# Patient Record
Sex: Female | Born: 1940
Health system: Southern US, Community
[De-identification: ages and names within clinical notes are randomized; demographics above are authoritative.]

## PROBLEM LIST (undated history)

## (undated) DIAGNOSIS — K589 Irritable bowel syndrome without diarrhea: Secondary | ICD-10-CM

## (undated) DIAGNOSIS — D179 Benign lipomatous neoplasm, unspecified: Secondary | ICD-10-CM

## (undated) DIAGNOSIS — T7840XA Allergy, unspecified, initial encounter: Secondary | ICD-10-CM

## (undated) DIAGNOSIS — R011 Cardiac murmur, unspecified: Secondary | ICD-10-CM

## (undated) DIAGNOSIS — D539 Nutritional anemia, unspecified: Secondary | ICD-10-CM

## (undated) DIAGNOSIS — N840 Polyp of corpus uteri: Secondary | ICD-10-CM

## (undated) DIAGNOSIS — C4490 Unspecified malignant neoplasm of skin, unspecified: Secondary | ICD-10-CM

## (undated) DIAGNOSIS — M1990 Unspecified osteoarthritis, unspecified site: Secondary | ICD-10-CM

## (undated) DIAGNOSIS — K219 Gastro-esophageal reflux disease without esophagitis: Secondary | ICD-10-CM

## (undated) DIAGNOSIS — J449 Chronic obstructive pulmonary disease, unspecified: Secondary | ICD-10-CM

## (undated) DIAGNOSIS — M8580 Other specified disorders of bone density and structure, unspecified site: Secondary | ICD-10-CM

## (undated) DIAGNOSIS — I700 Atherosclerosis of aorta: Secondary | ICD-10-CM

## (undated) DIAGNOSIS — E785 Hyperlipidemia, unspecified: Secondary | ICD-10-CM

## (undated) DIAGNOSIS — G43909 Migraine, unspecified, not intractable, without status migrainosus: Secondary | ICD-10-CM

## (undated) DIAGNOSIS — K635 Polyp of colon: Secondary | ICD-10-CM

## (undated) DIAGNOSIS — H269 Unspecified cataract: Secondary | ICD-10-CM

## (undated) HISTORY — DX: Polyp of colon: K63.5

## (undated) HISTORY — DX: Benign lipomatous neoplasm, unspecified: D17.9

## (undated) HISTORY — DX: Cardiac murmur, unspecified: R01.1

## (undated) HISTORY — DX: Gastro-esophageal reflux disease without esophagitis: K21.9

## (undated) HISTORY — DX: Polyp of corpus uteri: N84.0

## (undated) HISTORY — DX: Allergy, unspecified, initial encounter: T78.40XA

## (undated) HISTORY — DX: Unspecified malignant neoplasm of skin, unspecified: C44.90

## (undated) HISTORY — PX: COLONOSCOPY: SHX174

## (undated) HISTORY — PX: TONSILLECTOMY AND ADENOIDECTOMY: SUR1326

## (undated) HISTORY — DX: Hyperlipidemia, unspecified: E78.5

## (undated) HISTORY — PX: CATARACT EXTRACTION: SUR2

## (undated) HISTORY — DX: Irritable bowel syndrome, unspecified: K58.9

## (undated) HISTORY — DX: Chronic obstructive pulmonary disease, unspecified: J44.9

## (undated) HISTORY — DX: Other specified disorders of bone density and structure, unspecified site: M85.80

## (undated) HISTORY — DX: Atherosclerosis of aorta: I70.0

## (undated) HISTORY — DX: Unspecified cataract: H26.9

## (undated) HISTORY — PX: MOLE REMOVAL: SHX2046

## (undated) HISTORY — DX: Unspecified osteoarthritis, unspecified site: M19.90

## (undated) HISTORY — DX: Nutritional anemia, unspecified: D53.9

---

## 1998-07-14 DIAGNOSIS — D179 Benign lipomatous neoplasm, unspecified: Secondary | ICD-10-CM

## 1998-07-14 HISTORY — DX: Benign lipomatous neoplasm, unspecified: D17.9

## 1998-09-04 ENCOUNTER — Other Ambulatory Visit: Admission: RE | Admit: 1998-09-04 | Discharge: 1998-09-04 | Payer: Self-pay | Admitting: Obstetrics and Gynecology

## 1999-05-24 ENCOUNTER — Encounter: Admission: RE | Admit: 1999-05-24 | Discharge: 1999-05-24 | Payer: Self-pay | Admitting: Geriatric Medicine

## 1999-05-24 ENCOUNTER — Encounter: Payer: Self-pay | Admitting: Geriatric Medicine

## 1999-10-09 ENCOUNTER — Other Ambulatory Visit: Admission: RE | Admit: 1999-10-09 | Discharge: 1999-10-09 | Payer: Self-pay | Admitting: Obstetrics and Gynecology

## 2000-06-01 ENCOUNTER — Encounter: Admission: RE | Admit: 2000-06-01 | Discharge: 2000-06-01 | Payer: Self-pay | Admitting: Geriatric Medicine

## 2000-06-01 ENCOUNTER — Encounter: Payer: Self-pay | Admitting: Geriatric Medicine

## 2000-11-03 ENCOUNTER — Other Ambulatory Visit: Admission: RE | Admit: 2000-11-03 | Discharge: 2000-11-03 | Payer: Self-pay | Admitting: Obstetrics and Gynecology

## 2001-06-07 ENCOUNTER — Encounter: Payer: Self-pay | Admitting: Geriatric Medicine

## 2001-06-07 ENCOUNTER — Encounter: Admission: RE | Admit: 2001-06-07 | Discharge: 2001-06-07 | Payer: Self-pay | Admitting: Geriatric Medicine

## 2001-10-21 ENCOUNTER — Other Ambulatory Visit: Admission: RE | Admit: 2001-10-21 | Discharge: 2001-10-21 | Payer: Self-pay | Admitting: *Deleted

## 2002-06-08 ENCOUNTER — Encounter: Admission: RE | Admit: 2002-06-08 | Discharge: 2002-06-08 | Payer: Self-pay | Admitting: Geriatric Medicine

## 2002-06-08 ENCOUNTER — Encounter: Payer: Self-pay | Admitting: Geriatric Medicine

## 2002-10-31 ENCOUNTER — Other Ambulatory Visit: Admission: RE | Admit: 2002-10-31 | Discharge: 2002-10-31 | Payer: Self-pay | Admitting: *Deleted

## 2003-06-11 ENCOUNTER — Emergency Department (HOSPITAL_COMMUNITY): Admission: AD | Admit: 2003-06-11 | Discharge: 2003-06-11 | Payer: Self-pay | Admitting: Family Medicine

## 2003-06-21 ENCOUNTER — Encounter: Admission: RE | Admit: 2003-06-21 | Discharge: 2003-06-21 | Payer: Self-pay | Admitting: Geriatric Medicine

## 2003-12-15 ENCOUNTER — Other Ambulatory Visit: Admission: RE | Admit: 2003-12-15 | Discharge: 2003-12-15 | Payer: Self-pay | Admitting: *Deleted

## 2004-07-01 ENCOUNTER — Ambulatory Visit: Payer: Self-pay | Admitting: Internal Medicine

## 2004-07-19 ENCOUNTER — Encounter: Admission: RE | Admit: 2004-07-19 | Discharge: 2004-07-19 | Payer: Self-pay | Admitting: *Deleted

## 2004-08-16 ENCOUNTER — Ambulatory Visit: Payer: Self-pay | Admitting: Internal Medicine

## 2005-04-02 ENCOUNTER — Other Ambulatory Visit: Admission: RE | Admit: 2005-04-02 | Discharge: 2005-04-02 | Payer: Self-pay | Admitting: *Deleted

## 2005-09-26 ENCOUNTER — Encounter: Admission: RE | Admit: 2005-09-26 | Discharge: 2005-09-26 | Payer: Self-pay | Admitting: Geriatric Medicine

## 2006-06-13 DIAGNOSIS — N840 Polyp of corpus uteri: Secondary | ICD-10-CM

## 2006-06-13 HISTORY — DX: Polyp of corpus uteri: N84.0

## 2006-06-16 ENCOUNTER — Other Ambulatory Visit: Admission: RE | Admit: 2006-06-16 | Discharge: 2006-06-16 | Payer: Self-pay | Admitting: Obstetrics and Gynecology

## 2006-11-11 ENCOUNTER — Encounter: Admission: RE | Admit: 2006-11-11 | Discharge: 2006-11-11 | Payer: Self-pay | Admitting: Geriatric Medicine

## 2007-08-16 ENCOUNTER — Other Ambulatory Visit: Admission: RE | Admit: 2007-08-16 | Discharge: 2007-08-16 | Payer: Self-pay | Admitting: Obstetrics and Gynecology

## 2007-11-29 ENCOUNTER — Telehealth: Payer: Self-pay | Admitting: Internal Medicine

## 2007-12-08 ENCOUNTER — Telehealth: Payer: Self-pay | Admitting: Internal Medicine

## 2007-12-08 ENCOUNTER — Ambulatory Visit: Payer: Self-pay | Admitting: Internal Medicine

## 2007-12-13 ENCOUNTER — Telehealth: Payer: Self-pay | Admitting: Internal Medicine

## 2007-12-13 DIAGNOSIS — K635 Polyp of colon: Secondary | ICD-10-CM

## 2007-12-13 HISTORY — DX: Polyp of colon: K63.5

## 2007-12-14 ENCOUNTER — Ambulatory Visit: Payer: Self-pay | Admitting: Internal Medicine

## 2007-12-14 ENCOUNTER — Encounter: Payer: Self-pay | Admitting: Internal Medicine

## 2007-12-16 ENCOUNTER — Encounter: Payer: Self-pay | Admitting: Internal Medicine

## 2007-12-23 ENCOUNTER — Encounter: Admission: RE | Admit: 2007-12-23 | Discharge: 2007-12-23 | Payer: Self-pay | Admitting: Geriatric Medicine

## 2008-08-07 ENCOUNTER — Inpatient Hospital Stay (HOSPITAL_COMMUNITY): Admission: EM | Admit: 2008-08-07 | Discharge: 2008-08-08 | Payer: Self-pay | Admitting: *Deleted

## 2008-08-07 ENCOUNTER — Ambulatory Visit: Payer: Self-pay | Admitting: Cardiology

## 2008-09-08 ENCOUNTER — Ambulatory Visit (HOSPITAL_COMMUNITY): Admission: RE | Admit: 2008-09-08 | Discharge: 2008-09-08 | Payer: Self-pay | Admitting: Geriatric Medicine

## 2008-12-27 ENCOUNTER — Encounter: Admission: RE | Admit: 2008-12-27 | Discharge: 2008-12-27 | Payer: Self-pay | Admitting: Geriatric Medicine

## 2009-07-14 LAB — HM DEXA SCAN

## 2009-12-31 ENCOUNTER — Encounter: Admission: RE | Admit: 2009-12-31 | Discharge: 2009-12-31 | Payer: Self-pay | Admitting: Obstetrics and Gynecology

## 2010-02-04 LAB — HM PAP SMEAR: HM Pap smear: NEGATIVE

## 2010-08-04 ENCOUNTER — Encounter: Payer: Self-pay | Admitting: Geriatric Medicine

## 2010-10-28 LAB — CARDIAC PANEL(CRET KIN+CKTOT+MB+TROPI)
CK, MB: 3.6 ng/mL (ref 0.3–4.0)
Relative Index: 3.6 — ABNORMAL HIGH (ref 0.0–2.5)
Relative Index: INVALID (ref 0.0–2.5)
Troponin I: <0.01 ng/mL (ref 0.00–0.06)

## 2010-10-28 LAB — DIFFERENTIAL
Basophils Absolute: 0 10*3/uL (ref 0.0–0.1)
Basophils Relative: 1 % (ref 0–1)
Eosinophils Absolute: 0.1 10*3/uL (ref 0.0–0.7)
Eosinophils Relative: 2 % (ref 0–5)
Lymphs Abs: 1.6 10*3/uL (ref 0.7–4.0)
Neutrophils Relative %: 50 % (ref 43–77)

## 2010-10-28 LAB — CBC
HCT: 38.6 % (ref 36.0–46.0)
Hemoglobin: 13 g/dL (ref 12.0–15.0)
MCV: 95.7 fL (ref 78.0–100.0)
Platelets: 202 10*3/uL (ref 150–400)
RDW: 13.1 % (ref 11.5–15.5)
WBC: 4.3 10*3/uL (ref 4.0–10.5)

## 2010-10-28 LAB — POCT I-STAT, CHEM 8
BUN: 24 mg/dL — ABNORMAL HIGH (ref 6–23)
Chloride: 108 mEq/L (ref 96–112)
Creatinine, Ser: 1.3 mg/dL — ABNORMAL HIGH (ref 0.4–1.2)
HCT: 40 % (ref 36.0–46.0)
Hemoglobin: 13.6 g/dL (ref 12.0–15.0)
Potassium: 4.3 mEq/L (ref 3.5–5.1)
Sodium: 139 mEq/L (ref 135–145)
TCO2: 26 mmol/L (ref 0–100)

## 2010-10-28 LAB — T4, FREE: Free T4: 0.92 ng/dL (ref 0.89–1.80)

## 2010-10-28 LAB — T3: T3, Total: 75.1 ng/dl — ABNORMAL LOW (ref 80.0–204.0)

## 2010-10-28 LAB — CK TOTAL AND CKMB (NOT AT ARMC)
CK, MB: 4 ng/mL (ref 0.3–4.0)
Total CK: 112 U/L (ref 7–177)

## 2010-10-28 LAB — POCT CARDIAC MARKERS

## 2010-10-28 LAB — LIPID PANEL
LDL Cholesterol: 119 mg/dL — ABNORMAL HIGH (ref 0–99)
Total CHOL/HDL Ratio: 2.3 RATIO
Triglycerides: 75 mg/dL (ref <150)

## 2010-10-28 LAB — TSH: TSH: 5.202 u[IU]/mL — ABNORMAL HIGH (ref 0.350–4.500)

## 2010-10-28 LAB — BASIC METABOLIC PANEL
CO2: 25 mEq/L (ref 19–32)
GFR calc Af Amer: >60 mL/min (ref >60)

## 2010-11-26 NOTE — Consult Note (Signed)
NAMELETIZIA, HOOK                 ACCOUNT NO.:  [id number]   MEDICAL RECORD NO.:  [id number]          PATIENT TYPE:  INP   LOCATION:  3707                         FACILITY:  [facility]   PHYSICIAN:  Francisca December, M.D.  DATE OF BIRTH:  1940/08/08   DATE OF CONSULTATION:  08/08/2008  DATE OF DISCHARGE:  08/08/2008                                 CONSULTATION   REFERRING PHYSICIAN:  Kela Millin, MD   REASON FOR CONSULTATION:  Bilateral arm pain.   HISTORY OF PRESENT ILLNESS:  Ms. Pellicane is a very pleasant 70 year old  woman who is generally quite healthy who presented to [facility] Emergency  Room on the morning of August 07, 2008 after three episodes of  bilateral arm aching pain.  This was initially from the elbow to the  shoulder and felt like blood pressure cuffs being inflated.  That  happened at rest.  It was nocturnal.  A few days later again, she had a  second episode.  These were associated with radiation down into the  fingertips and some numbness and tingling.  She finally had an episode  at 4 a.m. on the morning of admission that was associated with some  nausea and diaphoresis.  No shortness of breath.  She does not recall  the discomfort radiated across her chest.  The episode lasted about 20-  30 minutes and she presented to the emergency room for further  evaluation.   The patient denies previous cardiac symptoms.  She is not treated for  hypertension, diabetes or dyslipidemia.  She is very physically active.   PAST MEDICAL HISTORY:  Unremarkable:  She has allergic rhinitis and is  on postmenopausal estrogen replacement.   DRUG ALLERGIES:  IV CONTRAST DYE which causes urticaria and also some  throat swelling.  LIDOCAINE has caused heart racing.   MEDICATIONS:  She takes multivitamin daily, uses nasal spray and has an  estradiol compound that she uses daily.   SOCIAL HISTORY:  She is a social drinker.  No tobacco use.  Discontinued  40 years ago.  She is retired.   Volunteers at the hospital frequently.   REVIEW OF SYSTEMS:  Ten-point review of systems is negative except as  mentioned above.   PHYSICAL EXAMINATION:  VITAL SIGNS:  The blood pressure is 138/58, pulse  is 60 and regular, respiratory rate 18, temperature 97.7, O2 saturation  on room air 99%.  GENERAL:  This is a well-appearing 70 year old thin Caucasian woman in  no distress.  HEENT:  Unremarkable.  The head is atraumatic and normocephalic.  The  pupils are equal, round and reactive to light.  Sclerae are anicteric.  Oral mucosa is pink and moist.  Teeth and gums in good repair.  Tongue  is not coated.  NECK:  Supple without thyromegaly or masses.  The carotid upstrokes are  normal.  There is no bruit.  There is no jugular venous distention.  CHEST:  Clear with adequate excursion bilaterally.  No wheezes, rales or  rhonchi.  HEART:  Regular rhythm, normal S1-S2 is heard.  No S3-S4, murmur, click  or rub noted.  ABDOMEN:  Soft, scaphoid, nontender.  No midline pulsatile mass.  EXTERNAL GENITALIA:  Without lesions.  RECTAL:  Not performed.  EXTREMITY:  Full range of motion.  No edema and intact distal pulses and  this includes both legs and upper extremities.  NEUROLOGIC:  Cranial  nerves II-XII are intact.  Motor and sensory grossly intact.  Gait is  intact.  SKIN:  Warm, dry and clear.   ACCESSORY CLINICAL DATA:  Admission hemogram is normal as are serum  electrolytes, creatinine is 1.04, BUN 22, CK-MB, troponin and total CK  enzymes are normal x3.  There was a slight increase in relative index on  her initial CK upper limit of normal being 2.5 and the result being 3.6.  Troponins, however, have been completely negative.  Her total  cholesterol 239 with a triglyceride of 75, HDL of 105 and LDL 119, and  ratio 2.3.  Her TSH was slightly elevated at 5.202.  Her T3 is slightly  low at 75.1, the T4 is normal at 0.92.   Electrocardiogram on admission normal.  Repeat ECG the  following morning  showed a septal Q-wave that is new.  This could easily be due to lead  placement.   Admission chest x-ray showed no acute disease.  There was slight  hyperexpansion suggesting emphysema.   IMPRESSION:  1. Unstable angina which is somewhat atypical in nature but still in a      70 year old woman quite concerning for a cardiac etiology.  There      does not appear to be any evidence of myocardial necrosis.  2. Risk factors for coronary artery disease are age alone.  3. Mild hypothyroidism.   PLAN:  1. The patient is considered low risk at this point having ruled out      with no recurrent symptoms.  Risk factors are minimal.  She is an      excellent candidate for outpatient myocardial perfusion imaging      with exercise.  This has been arranged for later in a week.  2. We will discharge on aspirin and sublingual nitroglycerin as well      as her previous  medications.  3. I will communicate with her primary physician, Dr. Merlene Laughter      regarding the mild hypothyroidism and his desire whether or not to      treat.      Francisca December, M.D.  Electronically Signed     JHE/MEDQ  D:  08/08/2008  T:  08/08/2008  Job:  [id number]   cc:   Hal T. Stoneking, M.D.

## 2010-11-26 NOTE — H&P (Signed)
NAME:  Cindy Valencia, Cindy Valencia                 ACCOUNT NO.:  [id number]   MEDICAL RECORD NO.:  [id number]          PATIENT TYPE:  INP   LOCATION:  1825                         FACILITY:  [facility]   PHYSICIAN:  Darryl D. Prime, MD    DATE OF BIRTH:  Feb 06, 1941   DATE OF ADMISSION:  08/07/2008  DATE OF DISCHARGE:                              HISTORY & PHYSICAL   The patient is full code.   PRIMARY CARE PHYSICIAN:  Hal T. Stoneking, M.D.   CHIEF COMPLAINT:  Chest pain.   HISTORY OF PRESENT ILLNESS:  Cindy Valencia is a 70 year old female who has  no significant past medical history who notes 3 episodes of bilateral  arm tightness over the last 7 days.  These symptoms were of relatively  sudden onset with radiation to her elbows on the first two occasions.  At 4:00 a.m. she had  symptoms at rest that typically last a minute with  radiation to her fingertips.  The patient denies any chest pain, nausea  or shortness of breath.  She did have associated tachypalpitations and  diaphoresis with the last episode.  The patient checked on the Internet  to find the etiology of these symptoms and notes that they sometimes may  be associated with an acute coronary syndrome.  Her husband brought her  to the emergency room.  The patient chewed an aspirin before coming to  the emergency room, full dose.  Her last stress test was approximately 5  years ago which was unremarkable.   PAST MEDICAL HISTORY AND PAST SURGICAL HISTORY:  History of allergic  rhinitis.   ALLERGIES:  She is allergic to IV CONTRAST DYE which causes hives and  her throat closing off  She apparently is also allergic to LIDOCAINE  which causes heart racing.   MEDICATIONS:  She is on multivitamin daily, a nasal spray she is unsure  of what type, and estradiol/Premarin compound daily.   SOCIAL HISTORY:  She is a social drinker.  She denies any tobacco use  currently, discontinued 40 years ago, smoked for a few years.   FAMILY HISTORY:  Mother  passed away possibly due to complications of  heart disease at the age of 90.  No premature coronary artery in the  family.   REVIEW OF SYSTEMS:  Fourteen-point review of systems negative unless  stated above.   PHYSICAL EXAM:  VITAL SIGNS:  Temperature is 97.7 with a blood pressure  138/58, pulse of 51, respiratory rate of 18, sats are 99% on room air.  GENERAL:  She is a female who looks much younger than her stated age  sitting upright in no acute distress.  HEENT:  Normocephalic, atraumatic.  Pupils are equal, round and reactive  to light.  The oropharynx is moist.  NECK:  Supple with no lymphadenopathy or thyromegaly.  No carotid  bruits, no jugular venous distention.  LUNGS:  Clear to auscultation bilaterally.  CARDIOVASCULAR EXAM:  Regular rhythm and rate with no murmurs, rubs, or  gallops.  Normal S1 and S2.  No S3 or S4.  ABDOMEN:  Soft, nontender, nondistended.  EXTREMITIES:  Show no clubbing, cyanosis or edema.   LABORATORY DATA:  White count is 4.3 with a hemoglobin of 13, hematocrit  38.6, platelets 202, segs of 50%.  Cardiac markers point of care at 5:57  a.m. were negative.  Chest x-ray showed no acute cardiopulmonary  disease.  No prior chest x-ray to compare.  EKG showed sinus bradycardia  at 48 beats per minute, otherwise negative.  No prior available.   ASSESSMENT AND PLAN:  This is a patient with atypical chest pain who  will be admitted to rule out acute coronary syndrome to a telemetry bed.  We will check cardiac markers x3, a lipid panel and a TSH.  We will set  her up for a Cardiolite stress test today with the results of this  determining the direction of her further care.  She will be on aspirin.      Darryl D. Prime, MD  Electronically Signed     DDP/MEDQ  D:  08/07/2008  T:  08/07/2008  Job:  [id number]

## 2010-12-11 ENCOUNTER — Other Ambulatory Visit: Payer: Self-pay | Admitting: Geriatric Medicine

## 2010-12-11 DIAGNOSIS — Z1231 Encounter for screening mammogram for malignant neoplasm of breast: Secondary | ICD-10-CM

## 2011-01-06 ENCOUNTER — Ambulatory Visit
Admission: RE | Admit: 2011-01-06 | Discharge: 2011-01-06 | Disposition: A | Discharge status: Home / Self Care | Payer: Medicare Other | Source: Ambulatory Visit | Attending: Geriatric Medicine | Admitting: Geriatric Medicine

## 2011-01-06 DIAGNOSIS — Z1231 Encounter for screening mammogram for malignant neoplasm of breast: Secondary | ICD-10-CM

## 2011-06-10 ENCOUNTER — Other Ambulatory Visit: Payer: Self-pay | Admitting: Geriatric Medicine

## 2011-06-10 DIAGNOSIS — M4800 Spinal stenosis, site unspecified: Secondary | ICD-10-CM

## 2011-06-12 ENCOUNTER — Other Ambulatory Visit: Payer: Medicare Other

## 2011-06-12 ENCOUNTER — Ambulatory Visit
Admission: RE | Admit: 2011-06-12 | Discharge: 2011-06-12 | Disposition: A | Discharge status: Home / Self Care | Payer: Medicare Other | Source: Ambulatory Visit | Attending: Geriatric Medicine | Admitting: Geriatric Medicine

## 2011-06-12 DIAGNOSIS — M4800 Spinal stenosis, site unspecified: Secondary | ICD-10-CM

## 2011-08-28 DIAGNOSIS — M899 Disorder of bone, unspecified: Secondary | ICD-10-CM | POA: Diagnosis not present

## 2011-08-28 DIAGNOSIS — J309 Allergic rhinitis, unspecified: Secondary | ICD-10-CM | POA: Diagnosis not present

## 2011-08-28 DIAGNOSIS — E7800 Pure hypercholesterolemia, unspecified: Secondary | ICD-10-CM | POA: Diagnosis not present

## 2011-08-28 DIAGNOSIS — R110 Nausea: Secondary | ICD-10-CM | POA: Diagnosis not present

## 2011-08-28 DIAGNOSIS — Z0000 Encounter for general adult medical examination without abnormal findings: Secondary | ICD-10-CM | POA: Diagnosis not present

## 2011-08-28 DIAGNOSIS — M4800 Spinal stenosis, site unspecified: Secondary | ICD-10-CM | POA: Diagnosis not present

## 2011-08-28 DIAGNOSIS — Z79899 Other long term (current) drug therapy: Secondary | ICD-10-CM | POA: Diagnosis not present

## 2011-08-28 DIAGNOSIS — M949 Disorder of cartilage, unspecified: Secondary | ICD-10-CM | POA: Diagnosis not present

## 2011-09-02 DIAGNOSIS — Z79899 Other long term (current) drug therapy: Secondary | ICD-10-CM | POA: Diagnosis not present

## 2011-09-02 DIAGNOSIS — E7800 Pure hypercholesterolemia, unspecified: Secondary | ICD-10-CM | POA: Diagnosis not present

## 2011-09-04 DIAGNOSIS — M545 Low back pain, unspecified: Secondary | ICD-10-CM | POA: Diagnosis not present

## 2011-09-04 DIAGNOSIS — M4120 Other idiopathic scoliosis, site unspecified: Secondary | ICD-10-CM | POA: Diagnosis not present

## 2011-09-04 DIAGNOSIS — M48061 Spinal stenosis, lumbar region without neurogenic claudication: Secondary | ICD-10-CM | POA: Diagnosis not present

## 2011-09-09 DIAGNOSIS — M545 Low back pain: Secondary | ICD-10-CM | POA: Diagnosis not present

## 2011-09-12 DIAGNOSIS — M545 Low back pain: Secondary | ICD-10-CM | POA: Diagnosis not present

## 2011-09-23 DIAGNOSIS — M545 Low back pain: Secondary | ICD-10-CM | POA: Diagnosis not present

## 2011-09-23 DIAGNOSIS — Z79899 Other long term (current) drug therapy: Secondary | ICD-10-CM | POA: Diagnosis not present

## 2011-09-26 DIAGNOSIS — H524 Presbyopia: Secondary | ICD-10-CM | POA: Diagnosis not present

## 2011-10-06 DIAGNOSIS — M545 Low back pain: Secondary | ICD-10-CM | POA: Diagnosis not present

## 2011-11-19 DIAGNOSIS — M47817 Spondylosis without myelopathy or radiculopathy, lumbosacral region: Secondary | ICD-10-CM | POA: Diagnosis not present

## 2011-12-25 ENCOUNTER — Emergency Department (HOSPITAL_COMMUNITY)
Admission: EM | Admit: 2011-12-25 | Discharge: 2011-12-26 | Disposition: A | Discharge status: Home / Self Care | Payer: Medicare Other | Attending: Emergency Medicine | Admitting: Emergency Medicine

## 2011-12-25 ENCOUNTER — Encounter (HOSPITAL_COMMUNITY): Payer: Self-pay | Admitting: Emergency Medicine

## 2011-12-25 DIAGNOSIS — R112 Nausea with vomiting, unspecified: Secondary | ICD-10-CM | POA: Insufficient documentation

## 2011-12-25 DIAGNOSIS — R51 Headache: Secondary | ICD-10-CM | POA: Diagnosis not present

## 2011-12-25 HISTORY — DX: Migraine, unspecified, not intractable, without status migrainosus: G43.909

## 2011-12-25 NOTE — ED Notes (Signed)
PT. REPORTS MIGRAINE HEADACHE WITH NAUSEA AND VOMITTING ONSET TODAY UNRELIEVED BY PRESCRIPTION ZOFRAN AND PHENERGAN .

## 2011-12-25 NOTE — ED Notes (Signed)
Pt stated that she normally has heat induced migraines. Earlier this morning she was outside in the yard. She stated around 1400, she begin having a HA above her eyes and on the top of her head. She also started vomiting and having loose stools. She has not been able to eat or drink anything. No additional neurological deficits. Pt stated that she took 2 Zofran and ODT and 2 Phenergan suppositories with no success. Currently has icepack on head. Will continue to monitor.

## 2011-12-26 ENCOUNTER — Emergency Department (HOSPITAL_COMMUNITY): Payer: Medicare Other

## 2011-12-26 DIAGNOSIS — R51 Headache: Secondary | ICD-10-CM | POA: Diagnosis not present

## 2011-12-26 DIAGNOSIS — R112 Nausea with vomiting, unspecified: Secondary | ICD-10-CM | POA: Diagnosis not present

## 2011-12-26 MED ORDER — DIPHENHYDRAMINE HCL 50 MG/ML IJ SOLN
25.0000 mg | Freq: Once | INTRAMUSCULAR | Status: AC
  Administered 2011-12-26: 25 mg via INTRAVENOUS
  Filled 2011-12-26: qty 1

## 2011-12-26 MED ORDER — SODIUM CHLORIDE 0.9 % IV BOLUS (SEPSIS)
1000.0000 mL | Freq: Once | INTRAVENOUS | Status: AC
  Administered 2011-12-26: 1000 mL via INTRAVENOUS

## 2011-12-26 MED ORDER — DEXAMETHASONE SODIUM PHOSPHATE 10 MG/ML IJ SOLN
10.0000 mg | Freq: Once | INTRAMUSCULAR | Status: AC
  Administered 2011-12-26: 10 mg via INTRAVENOUS
  Filled 2011-12-26: qty 1

## 2011-12-26 MED ORDER — KETOROLAC TROMETHAMINE 30 MG/ML IJ SOLN
30.0000 mg | Freq: Once | INTRAMUSCULAR | Status: AC
  Administered 2011-12-26: 30 mg via INTRAVENOUS
  Filled 2011-12-26: qty 1

## 2011-12-26 MED ORDER — METOCLOPRAMIDE HCL 5 MG/ML IJ SOLN
10.0000 mg | Freq: Once | INTRAMUSCULAR | Status: AC
  Administered 2011-12-26: 10 mg via INTRAVENOUS
  Filled 2011-12-26: qty 2

## 2011-12-26 NOTE — Discharge Instructions (Signed)
You were seen and treated for your symptoms of headache, nausea and vomiting. Your CT scan did not show any concerning cause for your symptoms today. You were treated for your headache in symptoms in the emergency room and reported feeling much better. At this time your providers feel you may return home and continue to followup with your primary care provider. Return to emergency room if you have any worsening symptoms.   Headache, General, Unknown Cause The specific cause of your headache may not have been found today. There are many causes and types of headache. A few common ones are:  Tension headache.   Migraine.   Infections (examples: dental and sinus infections).   Bone and/or joint problems in the neck or jaw.   Depression.   Eye problems.  These headaches are not life threatening.  Headaches can sometimes be diagnosed by a patient history and a physical exam. Sometimes, lab and imaging studies (such as x-ray and/or CT scan) are used to rule out more serious problems. In some cases, a spinal tap (lumbar puncture) may be requested. There are many times when your exam and tests may be normal on the first visit even when there is a serious problem causing your headaches. Because of that, it is very important to follow up with your doctor or local clinic for further evaluation. FINDING OUT THE RESULTS OF TESTS  If a radiology test was performed, a radiologist will review your results.   You will be contacted by the emergency department or your physician if any test results require a change in your treatment plan.   Not all test results may be available during your visit. If your test results are not back during the visit, make an appointment with your caregiver to find out the results. Do not assume everything is normal if you have not heard from your caregiver or the medical facility. It is important for you to follow up on all of your test results.  HOME CARE INSTRUCTIONS   Keep  follow-up appointments with your caregiver, or any specialist referral.   Only take over-the-counter or prescription medicines for pain, discomfort, or fever as directed by your caregiver.   Biofeedback, massage, or other relaxation techniques may be helpful.   Ice packs or heat applied to the head and neck can be used. Do this three to four times per day, or as needed.   Call your doctor if you have any questions or concerns.   If you smoke, you should quit.  SEEK MEDICAL CARE IF:   You develop problems with medications prescribed.   You do not respond to or obtain relief from medications.   You have a change from the usual headache.   You develop nausea or vomiting.  SEEK IMMEDIATE MEDICAL CARE IF:   If your headache becomes severe.   You have an unexplained oral temperature above 102 F (38.9 C), or as your caregiver suggests.   You have a stiff neck.   You have loss of vision.   You have muscular weakness.   You have loss of muscular control.   You develop severe symptoms different from your first symptoms.   You start losing your balance or have trouble walking.   You feel faint or pass out.  MAKE SURE YOU:   Understand these instructions.   Will watch your condition.   Will get help right away if you are not doing well or get worse.  Document Released: 06/30/2005 Document Revised: 06/19/2011  Document Reviewed: 02/17/2008 [facility] Patient Information 2012 [place], [place].    RESOURCE GUIDE  Chronic Pain Problems: Contact [facility]  [phone] Patients need to be referred by their primary care doctor.  Insufficient Money for Medicine: Contact United Way:  call "211" or [facility] 234-313-6041.  No Primary Care Doctor: - Call [facility]  [phone] - can help you locate a primary care doctor that  accepts your insurance, provides certain services, etc. - Physician Referral Service- [phone]  Agencies that provide  inexpensive medical care: - [facility]  [phone] - [facility]  [phone] - [facility]  [phone] - Women's Clinic  [phone] - Planned Parenthood  [phone] [facility]  [phone]  Medicaid-accepting [facility] Providers: - [facility]- [address]  [phone], Mon-Fri 9am-7pm, Sat 9am-1pm - [facility]- [address] [place], [address] [place]  [phone] - [facility]- [address] [place]  [phone] [facility]- [address]  [phone] - Renaye Rakers- [address] [place], [address], [phone]  Only accepts [place] patients after they have their name  applied to their card  Self Pay (no insurance) in [place]: - Sickle Cell Patients: Dr Willey Blade, [facility] Internal Medicine  [address] [place], [phone] - [facility]- [address] [place]  [phone]       [facility] [place]- [address]       -     [facility]- see information above (Speak to [employer] if you do not have insurance)       -  [facility]- [address] [place], [phone]       -  [facility]- [address],  [phone]       -  [facility]- [address], [phone]       -  Dr Julio Sicks-  [address], [place], [phone]       -  [facility]- [address] [phone]       -  [facility]- [address] [phone], also [address], [phone]       -    [facility]- [address] [place], [phone], 1st & 3rd Saturday   every month, 10am-1pm  1) Find a Doctor and Pay Out of Pocket Although you won't have to find out who is covered by your insurance plan, it is a good idea to ask around and get recommendations. You will then need to call the office and see if the doctor you have chosen will  accept you as a new patient and what types of options they offer for patients who are self-pay. Some doctors offer discounts or will set up payment plans for their patients who do not have insurance, but you will need to ask so you aren't surprised when you get to your appointment.  2) Contact Your Local Health Department Not all health departments have doctors that can see patients for sick visits, but many do, so it is worth a call to see if yours does. If you don't know where your local health department is, you can check in your phone book. The CDC also has a tool to  help you locate your state's health department, and many state websites also have listings of all of their local health departments.  3) Find a Walk-in Clinic If your illness is not likely to be very severe or complicated, you may want to try a walk in clinic. These are popping up all over the country in pharmacies, drugstores, and shopping centers. They're usually staffed by nurse practitioners or physician assistants that have been trained to treat common illnesses and complaints. They're usually fairly quick and inexpensive. However, if you have serious medical issues or chronic medical problems, these are probably not your best option  STD Testing - [facility] Department of [facility], STD Clinic, [address], [place], phone [phone] or [phone].  Monday - Friday, call for an appointment. [facility] Department of [employer], STD Clinic, [address], [place], phone [phone] or [phone].  Monday - Friday, call for an appointment.  Abuse/Neglect: [facility] Child Abuse Hotline (251)004-7053 [facility] Child Abuse Hotline 832-796-5463 (After Hours)  Emergency Shelter:  [facility] (570) 657-4441  Maternity Homes: - [facility] [place] [facility] 269-397-8686 - [facility] 970-132-4250  MRSA Hotline #:    (539)395-7864  [facility] Resources  [facility] [place]  United [employer] [facility] Dept. [address]                 [address]         [address] [place] [address]  Blondell Reveal Phone:  [phone]                                  Phone:  [phone]                   Phone:  [phone]  [facility], [phone] - [phone]       -     [facility] in [place], [address]                                  [phone], [facility] Child Abuse Hotline 332-749-2342 or 2404696211 (After Hours)   Behavioral Health Services  Substance Abuse Resources: - Alcohol and Drug Services  5171579196 - [facility] [phone] - [facility] [place] [phone] Floydene Flock [phone] - Residential & Outpatient Substance Abuse Program  [phone]  Psychological Services: [facility]  ([phone] Services  2606827076 - [facility], [phone] [place]. [address] [place], ACCESS LINE: (417)362-4952 or 2177126423, [employer]za  Dental Assistance  If unable to pay or uninsured, contact:  [facility] or [facility] to become qualified for the adult dental clinic.  Patients with Medicaid: [facility] [phone]  Sarina Ser, [phone] [address], [phone]  If unable to pay, or uninsured, contact [facility] 315-708-6709) or [facility] Department [phone] in [place], [phone] in [facility]) to become qualified for the adult dental clinic  Other Low-Cost Community Dental Services: - [facility]- [address] [place], [place], [place], [address], [phone], Ext. [address], 2nd and 4th Thursday of the month at 6:30am.   10 clients each day by appointment, can sometimes see walk-in patients if someone does not show for an appointment. [facility]- [address] Ether Griffins [place], [place], [address], [phone] - [facility]- [address], [place], [place], [address], [phone] - [facility]- [phone] [facility]- [phone] [facility] Department- [phone]

## 2011-12-26 NOTE — ED Notes (Signed)
Pt feeling much better after cocktail and wanting to go home, MD made aware

## 2011-12-26 NOTE — ED Notes (Signed)
Pt alert and oriented, with steady gait at time of discharge. Pt given discharge papers and papers explained. All questions answered and pt walked to discharge.  

## 2011-12-26 NOTE — ED Provider Notes (Signed)
History     CSN: [id number]  Arrival date & time 12/25/11  2134   First MD Initiated Contact with Patient 12/25/11 2341      Chief Complaint  Patient presents with  . Migraine   HPI   history provided by the patient. Patient is a 71 year old female with prior history of migraine headaches who presents with complaints of headache. Headache began earlier this afternoon. Patient states she has a history of headaches that are often caused from heat and temperature changes as well as barometric changes. Patient was outside and started taking some subtle sensations of the frontal headache above her eyes. Pain continued to increase and became very severe. Patient reports having 5 episodes of nausea and vomiting associated with headache and pain throughout the day. Patient tried using 2 Phenergan suppositories and ODT Zofran without improvement of nausea vomiting symptoms. Patient does report that headache pains are slightly improved with heat and point pressure to the back of the scalp and neck area. Patient called her primary care provider, Dr. Pete Glatter and was advised to come to the emergency room. Patient states she has never had a headache this severe or cause as much vomiting. He denies any other symptoms related to the headache. She denies any confusion, speech change, facial droop, vision changes, weakness or numbness in extremities, ataxia. Patient also denies any fever, chills, sweats.    Past Medical History  Diagnosis Date  . Migraine headache     History reviewed. No pertinent past surgical history.  No family history on file.  History  Substance Use Topics  . Smoking status: Never Smoker   . Smokeless tobacco: Not on file  . Alcohol Use: No    OB History    Grav Para Term Preterm Abortions TAB SAB Ect Mult Living                  Review of Systems  Constitutional: Negative for fever and chills.  Eyes: Positive for photophobia.  Gastrointestinal: Positive for nausea  and vomiting. Negative for abdominal pain, diarrhea and constipation.  Neurological: Positive for headaches. Negative for dizziness and light-headedness.    Allergies  Codeine and Contrast media  Home Medications   Current Outpatient Rx  Name Route Sig Dispense Refill  . OMEGA-3 FATTY ACIDS 1000 MG PO CAPS Oral Take 1 g by mouth daily.    Marland Kitchen GROUND FLAX SEEDS PO Oral Take 1 capsule by mouth every morning.    Marland Kitchen MELOXICAM 7.5 MG PO TABS Oral Take 7.5 mg by mouth 2 (two) times daily.    Marland Kitchen OVER THE COUNTER MEDICATION Oral Take 1 tablet by mouth daily. 500 mg Reservatrol    . OVER THE COUNTER MEDICATION Oral Take 2 tablets by mouth every morning. Ambertoes (vitamin)    . OVER THE COUNTER MEDICATION Oral Take 1-2 tablets by mouth 2 (two) times daily. Dark Cherry (vitamin) 1 in AM and 2 in PM    . VITAMIN D (CHOLECALCIFEROL) PO Oral Take 1 tablet by mouth daily.      BP 130/65  Pulse 66  Temp 97.8 F (36.6 C) (Oral)  Resp 18  SpO2 100%  Physical Exam  Nursing note and vitals reviewed. Constitutional: She is oriented to person, place, and time. She appears well-developed and well-nourished. No distress.  HENT:  Head: Normocephalic and atraumatic.  Eyes: Conjunctivae are normal. Pupils are equal, round, and reactive to light.  Neck: Normal range of motion. Neck supple.  No meningeal signs  Cardiovascular: Normal rate and regular rhythm.   Pulmonary/Chest: Effort normal and breath sounds normal. No respiratory distress. She has no wheezes.  Abdominal: Soft. There is no tenderness.  Neurological: She is alert and oriented to person, place, and time. She has normal strength. No cranial nerve deficit or sensory deficit. Gait normal.  Skin: Skin is warm and dry. No rash noted.  Psychiatric: She has a normal mood and affect. Her behavior is normal.    ED Course  Procedures   Ct Head Wo Contrast  12/26/2011  *RADIOLOGY REPORT*  Clinical Data: Headache  CT HEAD WITHOUT CONTRAST   Technique:  Contiguous axial images were obtained from the base of the skull through the vertex without contrast.  Comparison: None.  Findings: Prominence of the sulci, cisterns, and ventricles, in keeping with volume loss. There are subcortical and periventricular white matter hypodensities, a nonspecific finding most often seen with chronic microangiopathic changes.  There is no evidence for acute hemorrhage, overt hydrocephalus, mass lesion, or abnormal extra-axial fluid collection.  No definite CT evidence for acute cortical based (large artery) infarction. The visualized paranasal sinuses and mastoid air cells are predominately clear.  IMPRESSION: Mild white matter changes.  No definite acute intracranial abnormality.  Original Report Authenticated By: Waneta Martins, M.D.     1. Headache       MDM  Patient seen and evaluated. Patient in no acute distress.    Patient feels much better after medications. CT is unremarkable. Patient states she's ready to return home.    Angus Seller, [place] 12/26/11 [phone]

## 2011-12-28 NOTE — ED Provider Notes (Signed)
Medical screening examination/treatment/procedure(s) were performed by non-physician practitioner and as supervising physician I was immediately available for consultation/collaboration.  Johan Antonacci, MD 12/28/11 1933 

## 2012-01-14 ENCOUNTER — Other Ambulatory Visit: Payer: Self-pay | Admitting: Geriatric Medicine

## 2012-01-14 DIAGNOSIS — Z1231 Encounter for screening mammogram for malignant neoplasm of breast: Secondary | ICD-10-CM

## 2012-01-29 ENCOUNTER — Ambulatory Visit
Admission: RE | Admit: 2012-01-29 | Discharge: 2012-01-29 | Disposition: A | Discharge status: Home / Self Care | Payer: Medicare Other | Source: Ambulatory Visit | Attending: Geriatric Medicine | Admitting: Geriatric Medicine

## 2012-01-29 DIAGNOSIS — Z1231 Encounter for screening mammogram for malignant neoplasm of breast: Secondary | ICD-10-CM

## 2012-02-17 DIAGNOSIS — Z01419 Encounter for gynecological examination (general) (routine) without abnormal findings: Secondary | ICD-10-CM | POA: Diagnosis not present

## 2012-02-17 DIAGNOSIS — Z124 Encounter for screening for malignant neoplasm of cervix: Secondary | ICD-10-CM | POA: Diagnosis not present

## 2012-02-17 DIAGNOSIS — E559 Vitamin D deficiency, unspecified: Secondary | ICD-10-CM | POA: Diagnosis not present

## 2012-03-23 ENCOUNTER — Other Ambulatory Visit: Payer: Self-pay | Admitting: Geriatric Medicine

## 2012-03-23 DIAGNOSIS — Z1331 Encounter for screening for depression: Secondary | ICD-10-CM | POA: Diagnosis not present

## 2012-03-23 DIAGNOSIS — M542 Cervicalgia: Secondary | ICD-10-CM | POA: Diagnosis not present

## 2012-03-23 DIAGNOSIS — R110 Nausea: Secondary | ICD-10-CM | POA: Diagnosis not present

## 2012-03-25 ENCOUNTER — Inpatient Hospital Stay: Admission: RE | Admit: 2012-03-25 | Payer: Medicare Other | Source: Ambulatory Visit

## 2012-03-25 ENCOUNTER — Other Ambulatory Visit: Payer: Self-pay | Admitting: Geriatric Medicine

## 2012-03-25 ENCOUNTER — Ambulatory Visit
Admission: RE | Admit: 2012-03-25 | Discharge: 2012-03-25 | Disposition: A | Discharge status: Home / Self Care | Payer: Medicare Other | Source: Ambulatory Visit | Attending: Geriatric Medicine | Admitting: Geriatric Medicine

## 2012-03-25 DIAGNOSIS — M4802 Spinal stenosis, cervical region: Secondary | ICD-10-CM | POA: Diagnosis not present

## 2012-03-25 DIAGNOSIS — M542 Cervicalgia: Secondary | ICD-10-CM

## 2012-04-26 DIAGNOSIS — M542 Cervicalgia: Secondary | ICD-10-CM | POA: Diagnosis not present

## 2012-05-05 DIAGNOSIS — L821 Other seborrheic keratosis: Secondary | ICD-10-CM | POA: Diagnosis not present

## 2012-05-05 DIAGNOSIS — D239 Other benign neoplasm of skin, unspecified: Secondary | ICD-10-CM | POA: Diagnosis not present

## 2012-05-05 DIAGNOSIS — Q828 Other specified congenital malformations of skin: Secondary | ICD-10-CM | POA: Diagnosis not present

## 2012-05-05 DIAGNOSIS — D485 Neoplasm of uncertain behavior of skin: Secondary | ICD-10-CM | POA: Diagnosis not present

## 2012-05-05 DIAGNOSIS — Z85828 Personal history of other malignant neoplasm of skin: Secondary | ICD-10-CM | POA: Diagnosis not present

## 2012-05-05 DIAGNOSIS — L819 Disorder of pigmentation, unspecified: Secondary | ICD-10-CM | POA: Diagnosis not present

## 2012-05-05 DIAGNOSIS — D1801 Hemangioma of skin and subcutaneous tissue: Secondary | ICD-10-CM | POA: Diagnosis not present

## 2012-05-05 DIAGNOSIS — L570 Actinic keratosis: Secondary | ICD-10-CM | POA: Diagnosis not present

## 2012-05-05 DIAGNOSIS — L259 Unspecified contact dermatitis, unspecified cause: Secondary | ICD-10-CM | POA: Diagnosis not present

## 2012-05-17 DIAGNOSIS — G43909 Migraine, unspecified, not intractable, without status migrainosus: Secondary | ICD-10-CM | POA: Diagnosis not present

## 2012-05-17 DIAGNOSIS — R51 Headache: Secondary | ICD-10-CM | POA: Diagnosis not present

## 2012-05-31 DIAGNOSIS — Z0000 Encounter for general adult medical examination without abnormal findings: Secondary | ICD-10-CM | POA: Diagnosis not present

## 2012-05-31 DIAGNOSIS — R51 Headache: Secondary | ICD-10-CM | POA: Diagnosis not present

## 2012-05-31 DIAGNOSIS — E785 Hyperlipidemia, unspecified: Secondary | ICD-10-CM | POA: Diagnosis not present

## 2012-05-31 DIAGNOSIS — Z1331 Encounter for screening for depression: Secondary | ICD-10-CM | POA: Diagnosis not present

## 2012-05-31 DIAGNOSIS — J449 Chronic obstructive pulmonary disease, unspecified: Secondary | ICD-10-CM | POA: Diagnosis not present

## 2012-05-31 DIAGNOSIS — Z23 Encounter for immunization: Secondary | ICD-10-CM | POA: Diagnosis not present

## 2012-07-20 DIAGNOSIS — J310 Chronic rhinitis: Secondary | ICD-10-CM | POA: Diagnosis not present

## 2012-07-20 DIAGNOSIS — G43009 Migraine without aura, not intractable, without status migrainosus: Secondary | ICD-10-CM | POA: Diagnosis not present

## 2012-08-18 DIAGNOSIS — IMO0002 Reserved for concepts with insufficient information to code with codable children: Secondary | ICD-10-CM | POA: Insufficient documentation

## 2012-08-18 DIAGNOSIS — H35372 Puckering of macula, left eye: Secondary | ICD-10-CM | POA: Insufficient documentation

## 2012-08-18 DIAGNOSIS — H2510 Age-related nuclear cataract, unspecified eye: Secondary | ICD-10-CM | POA: Diagnosis not present

## 2012-08-18 DIAGNOSIS — H35379 Puckering of macula, unspecified eye: Secondary | ICD-10-CM | POA: Diagnosis not present

## 2012-09-02 DIAGNOSIS — G44219 Episodic tension-type headache, not intractable: Secondary | ICD-10-CM | POA: Diagnosis not present

## 2012-09-02 DIAGNOSIS — G43009 Migraine without aura, not intractable, without status migrainosus: Secondary | ICD-10-CM | POA: Diagnosis not present

## 2012-10-20 DIAGNOSIS — R05 Cough: Secondary | ICD-10-CM | POA: Diagnosis not present

## 2012-10-20 DIAGNOSIS — IMO0002 Reserved for concepts with insufficient information to code with codable children: Secondary | ICD-10-CM | POA: Diagnosis not present

## 2012-10-27 ENCOUNTER — Telehealth: Payer: Self-pay | Admitting: Nurse Practitioner

## 2012-10-27 NOTE — Telephone Encounter (Signed)
[facility]/patient has questions about hormone replacement therapy/please advise/[place]

## 2012-10-27 NOTE — Telephone Encounter (Signed)
Left message on home # to return call to office. Cell # the mailbox is not set up.

## 2012-10-28 ENCOUNTER — Telehealth: Payer: Self-pay | Admitting: *Deleted

## 2012-10-28 MED ORDER — ESTRADIOL 0.5 MG PO TABS: ORAL_TABLET | ORAL | Status: DC

## 2012-10-28 NOTE — Telephone Encounter (Signed)
Patient called to get refill on medication . States and request to stay on same dose as last refill of  Estradiol 0.5/Progesterone 75mg  capsule one po daily. States this is better for her and does not have any anxiety or hot flashes with this dose and combination. Medication refilled to [facility] till her AEX in august. sue

## 2012-12-07 DIAGNOSIS — J309 Allergic rhinitis, unspecified: Secondary | ICD-10-CM | POA: Diagnosis not present

## 2012-12-07 DIAGNOSIS — E785 Hyperlipidemia, unspecified: Secondary | ICD-10-CM | POA: Diagnosis not present

## 2012-12-07 DIAGNOSIS — M545 Low back pain: Secondary | ICD-10-CM | POA: Diagnosis not present

## 2012-12-07 DIAGNOSIS — R51 Headache: Secondary | ICD-10-CM | POA: Diagnosis not present

## 2013-01-06 DIAGNOSIS — G43009 Migraine without aura, not intractable, without status migrainosus: Secondary | ICD-10-CM | POA: Diagnosis not present

## 2013-01-06 DIAGNOSIS — G44219 Episodic tension-type headache, not intractable: Secondary | ICD-10-CM | POA: Diagnosis not present

## 2013-01-17 ENCOUNTER — Other Ambulatory Visit: Payer: Self-pay

## 2013-01-17 DIAGNOSIS — Z1231 Encounter for screening mammogram for malignant neoplasm of breast: Secondary | ICD-10-CM

## 2013-01-26 ENCOUNTER — Encounter: Payer: Self-pay | Admitting: Internal Medicine

## 2013-01-31 ENCOUNTER — Telehealth: Payer: Self-pay | Admitting: Nurse Practitioner

## 2013-01-31 NOTE — Telephone Encounter (Signed)
Patient has had some bleeding that she would like to discuss with Shirlyn Goltz as soon as possible.

## 2013-01-31 NOTE — Telephone Encounter (Signed)
Patient reports vaginal bleeding noted this am and increased with activity.  Dark red.  She has had this before and assumes it is from "thinning" again.  Advised needs MD visit to eval and patient requests visit with Patty.  Advised that may need another visit for complete evaluation since Patty can not do all that may be needed. States she would not be able to tolerate Endo biopsy done when this happened before.  Discussed that there are other options, to discuss if necessary.  OV tomm with Dr Tresa Res at 1115.

## 2013-02-01 ENCOUNTER — Encounter: Payer: Self-pay | Admitting: Obstetrics and Gynecology

## 2013-02-01 ENCOUNTER — Ambulatory Visit (INDEPENDENT_AMBULATORY_CARE_PROVIDER_SITE_OTHER): Payer: Medicare Other | Admitting: Obstetrics and Gynecology

## 2013-02-01 VITALS — BP 136/72 | HR 60 | Resp 12 | Ht 61.0 in | Wt 111.2 lb

## 2013-02-01 DIAGNOSIS — N950 Postmenopausal bleeding: Secondary | ICD-10-CM

## 2013-02-01 NOTE — Patient Instructions (Signed)
We will call you to schedule your ultrasound.  

## 2013-02-01 NOTE — Progress Notes (Signed)
72 yo MWF G2P2 on E2 0.5 mg and P4 75 mg per day, compounded at [facility].  She had tried a lower dose of 0.375/50mg  but had hot flasesand called in Jan 2014 asking to go back up to her current dose.  Back in 2012, she was on this dose and had some vag bleeding.  Endo bx at that time was quite difficult d/t certical stenosis, and pt was very  Uncomfortable during the procedure and had a vagal response.  She declines ever having that procedure again.  Her last PUS was in 2007 at which time the EMS was 2.5 mm and the cavity was distended with fluid showing an 8 mm probably polyp.  Now just yesterday started having dark red bleeding most of the day but none so far today.  No missed or late pills.  Without HRT, has just terrible hot flashes that she finds intolerable.   Exam:  Ext, BUS, vagina all slightly atrophic but nl for age.  cx flush with vault, thread of dark blood at os.  BM:  Uterus mid, mobile, small, NT.  Adnexa:  NT and w/o mass  A:  PMB, with one previous episode in 2012.  On compounded HRT since 2007.  P:  Rec:  PUS.SHSG .  Discussed with pt who accepts.  Will schedule. Unsure if it will be possible to pass the SHSG catheter due to stenosis, but maybe cavity will be distended with fluid as it was in 2007 and will enable evaluation of the cavity without needing saline injected.

## 2013-02-03 ENCOUNTER — Ambulatory Visit: Payer: Medicare Other

## 2013-02-08 DIAGNOSIS — E785 Hyperlipidemia, unspecified: Secondary | ICD-10-CM | POA: Diagnosis not present

## 2013-02-08 DIAGNOSIS — Z79899 Other long term (current) drug therapy: Secondary | ICD-10-CM | POA: Diagnosis not present

## 2013-02-11 ENCOUNTER — Telehealth: Payer: Self-pay | Admitting: Obstetrics and Gynecology

## 2013-02-11 NOTE — Telephone Encounter (Signed)
LMCTB to discuss insurance benefits and schedule shgm

## 2013-02-14 ENCOUNTER — Ambulatory Visit: Payer: Medicare Other

## 2013-02-22 ENCOUNTER — Ambulatory Visit
Admission: RE | Admit: 2013-02-22 | Discharge: 2013-02-22 | Disposition: A | Discharge status: Home / Self Care | Payer: Medicare Other | Source: Ambulatory Visit

## 2013-02-22 ENCOUNTER — Encounter: Payer: Self-pay | Admitting: *Deleted

## 2013-02-22 DIAGNOSIS — Z1231 Encounter for screening mammogram for malignant neoplasm of breast: Secondary | ICD-10-CM

## 2013-02-24 ENCOUNTER — Ambulatory Visit (INDEPENDENT_AMBULATORY_CARE_PROVIDER_SITE_OTHER): Payer: Medicare Other | Admitting: Nurse Practitioner

## 2013-02-24 ENCOUNTER — Encounter: Payer: Self-pay | Admitting: Nurse Practitioner

## 2013-02-24 VITALS — BP 116/70 | HR 76 | Resp 12 | Ht 61.0 in | Wt 112.6 lb

## 2013-02-24 DIAGNOSIS — Z01419 Encounter for gynecological examination (general) (routine) without abnormal findings: Secondary | ICD-10-CM | POA: Diagnosis not present

## 2013-02-24 DIAGNOSIS — Z0000 Encounter for general adult medical examination without abnormal findings: Secondary | ICD-10-CM

## 2013-02-24 NOTE — Patient Instructions (Signed)

## 2013-02-24 NOTE — Progress Notes (Signed)
72 y.o. G2P2 Married Caucasian Fe here for annual exam.  She had another episode of vaginal spotting last month and came in to see Dr. Tresa Res.  She is scheduled PUS next week. No vaginal bleeding since.  Still wants to continue on HRT.  We did lower the dose last August, but by January was having problems with vaso symptoms and had to go back up in dose.  No LMP recorded. Patient is postmenopausal.          Sexually active: yes  The current method of family planning is post menopausal status.    Exercising: yes  cardio and weights  Smoker:  no  Health Maintenance: Pap:  02/04/2010  Negative  MMG:  02/2013 normal Colonoscopy:  12/14/2007  BMD:   07/2009  T Score: spine 0.5/ left hip neck -1.3  TDaP:  2010 Labs: PCP maintains all labs and urine.    reports that she has never smoked. She has never used smokeless tobacco. She reports that she does not drink alcohol or use illicit drugs.  Past Medical History  Diagnosis Date  . Migraine headache   . Colon polyp   . IBS (irritable bowel syndrome)   . Skin cancer   . Lipoma     right upper chest wall  . Skin cancer   . Endometrial polyp     Past Surgical History  Procedure Laterality Date  . Tonsillectomy and adenoidectomy    . Mole removal    . Vaginal delivery      x2     Current Outpatient Prescriptions  Medication Sig Dispense Refill  . atorvastatin (LIPITOR) 10 MG tablet 10 mg daily.      . calcium carbonate 200 MG capsule Take 250 mg by mouth 2 (two) times daily with a meal.      . estradiol (ESTRACE) 0.5 MG tablet Patient is to have a capsule estradiol 0.5/progesterone 75mg   Compound. Take one capsule daily.  30 tablet  4  . fish oil-omega-3 fatty acids 1000 MG capsule Take 1 g by mouth daily.      . Flaxseed, Linseed, (GROUND FLAX SEEDS PO) Take 1 capsule by mouth every morning.      Marland Kitchen glucosamine-chondroitin 500-400 MG tablet Take 1 tablet by mouth 3 (three) times daily.      Marland Kitchen MAGNESIUM CITRATE PO Take by mouth.      Marland Kitchen  OVER THE COUNTER MEDICATION Take 1 tablet by mouth daily. 500 mg Reservatrol      . OVER THE COUNTER MEDICATION Take 2 tablets by mouth every morning. Ambertoes (vitamin)      . OVER THE COUNTER MEDICATION Take 1-2 tablets by mouth 2 (two) times daily. Dark Cherry (vitamin) 1 in AM and 2 in PM      . VITAMIN D, CHOLECALCIFEROL, PO Take 1 tablet by mouth daily.       No current facility-administered medications for this visit.    Family History  Problem Relation Age of Onset  . Lung cancer Father   . Heart disease Father   . Heart attack Mother   . Osteoarthritis Mother   . Heart disease Maternal Grandfather   . Heart disease Maternal Grandmother   . Osteoarthritis Sister     ROS:  Pertinent items are noted in HPI.  Otherwise, a comprehensive ROS was negative.  Exam:   BP 116/70  Pulse 76  Resp 12  Ht 5\' 1"  (1.549 m)  Wt 112 lb 9.6 oz (51.075 kg)  BMI 21.29 kg/m2 Height: 5\' 1"  (154.9 cm)  Ht Readings from Last 3 Encounters:  02/24/13 5\' 1"  (1.549 m)  02/01/13 5\' 1"  (1.549 m)    General appearance: alert, cooperative and appears stated age Head: Normocephalic, without obvious abnormality, atraumatic Neck: no adenopathy, supple, symmetrical, trachea midline and thyroid normal to inspection and palpation Lungs: clear to auscultation bilaterally Breasts: normal appearance, no masses or tenderness Heart: regular rate and rhythm Abdomen: soft, non-tender; no masses,  no organomegaly Extremities: extremities normal, atraumatic, no cyanosis or edema Skin: Skin color, texture, turgor normal. No rashes or lesions Lymph nodes: Cervical, supraclavicular, and axillary nodes normal. No abnormal inguinal nodes palpated Neurologic: Grossly normal   Pelvic: External genitalia:  no lesions              Urethra:  normal appearing urethra with no masses, tenderness or lesions              Bartholin's and Skene's: normal                 Vagina: normal appearing vagina with normal color  and discharge, no lesions              Cervix: anteverted and stenotic              Pap taken: yes Bimanual Exam:  Uterus:  normal size, contour, position, consistency, mobility, non-tender              Adnexa: no mass, fullness, tenderness               Rectovaginal: Confirms               Anus:  normal sphincter tone, no lesions  A:  Well Woman with normal exam  postmenopausal on HRT  PMB with neg endo biopsy 03/04/11 - vaso vagal reaction  Recent episode of PMB - undergoing evaluation  P:   Pap smear as per guidelines   Mammogram due 02/2014  Refilled HRT at [facility] - unable to get into EPIC so wrote out RX for Estradiol/ progesterone 0.5-75 mg capsule daily #90/ 3 refills.  She is willing to try reduction of HRT again but will await next evaluation by Dr. Tresa Res.  Aware of potential side effects and risks of HRT including DVT, CVA, cancer.  Counseled on breast self exam, use and side effects of HRT, adequate intake of calcium and vitamin D, diet and exercise return annually or prn  An After Visit Summary was printed and given to the patient.

## 2013-02-25 MED ORDER — NONFORMULARY OR COMPOUNDED ITEM: Status: DC

## 2013-02-28 NOTE — Progress Notes (Signed)
Encounter reviewed by Dr. Brook Silva.  

## 2013-03-01 ENCOUNTER — Ambulatory Visit (INDEPENDENT_AMBULATORY_CARE_PROVIDER_SITE_OTHER): Payer: Medicare Other

## 2013-03-01 ENCOUNTER — Other Ambulatory Visit: Payer: Self-pay | Admitting: Obstetrics and Gynecology

## 2013-03-01 ENCOUNTER — Ambulatory Visit (INDEPENDENT_AMBULATORY_CARE_PROVIDER_SITE_OTHER): Payer: Medicare Other | Admitting: Obstetrics and Gynecology

## 2013-03-01 DIAGNOSIS — N950 Postmenopausal bleeding: Secondary | ICD-10-CM

## 2013-03-01 DIAGNOSIS — N9489 Other specified conditions associated with female genital organs and menstrual cycle: Secondary | ICD-10-CM

## 2013-03-01 LAB — IPS PAP SMEAR ONLY

## 2013-03-01 NOTE — Progress Notes (Signed)
72 yo MWF G2P2 with PMB on HRT compounded at [facility] - is estradiol 0.5 mg and progesterone 75 mg po qd.  Pt had a polyp seen on PUS done out of pt fear of ovarian cancer in 2007.  She was asymptomatic at that time, and no intervention was felt to be needed.  In 2012, she had some PMB and had an endometrial biopsy which was benign.  She experienced a vagal reaction after the biopsy, and the biopsy was quite uncomfortable for her.  Now she has experienced another episode of PMB, and is here for PUS evaluation.  The findings are as follows:      Pt declines any in office biopsy or SHSG.  We discussed the need to R/O malignancy.  Rec: D & C and hysteroscopic resection, to which she agrees.  Discussed the procedure and the recovery and questions answered.  Since I am retiring, she requests Dr. Hyacinth Meeker as her surgeon.  The pt watched the informed consent video today, and she will return to see Dr. Hyacinth Meeker pre op and we will go ahead and schedule her surgery with Dr. Hyacinth Meeker.

## 2013-03-01 NOTE — Patient Instructions (Signed)
We will call you to schedule your visit with Dr. Hyacinth Meeker and your surgery.

## 2013-03-17 ENCOUNTER — Telehealth: Payer: Self-pay | Admitting: *Deleted

## 2013-03-17 NOTE — Telephone Encounter (Signed)
Call to patient to check on date preferences for surgery. LMTCB. 

## 2013-04-06 NOTE — Telephone Encounter (Signed)
Return call to patient, LMTCB.  

## 2013-04-06 NOTE — Telephone Encounter (Signed)
Returning a call to Sally. °

## 2013-04-07 NOTE — Telephone Encounter (Signed)
Pre-epic chart to your office.  Surgical procedure was ordered by Dr Tresa Res.

## 2013-04-07 NOTE — Telephone Encounter (Signed)
Please call patient on her cell phone [phone]

## 2013-04-07 NOTE — Telephone Encounter (Signed)
Call back to patient to follow up on scheduling surgery and her date preferences.  She states she wants to meet Dr Cindy Valencia prior to scheduling surgery.  Patient last saw Dr Tresa Res on 03-01-13 forPUS  due to PMB. Appt scheduled for 04-12-13, declined earlier appt. (offered tomm and Mon).

## 2013-04-12 ENCOUNTER — Encounter: Payer: Self-pay | Admitting: Obstetrics & Gynecology

## 2013-04-12 ENCOUNTER — Ambulatory Visit (INDEPENDENT_AMBULATORY_CARE_PROVIDER_SITE_OTHER): Payer: Medicare Other | Admitting: Obstetrics & Gynecology

## 2013-04-12 VITALS — BP 116/78 | HR 58 | Resp 12 | Ht 61.0 in | Wt 112.8 lb

## 2013-04-12 DIAGNOSIS — N950 Postmenopausal bleeding: Secondary | ICD-10-CM

## 2013-04-12 DIAGNOSIS — N840 Polyp of corpus uteri: Secondary | ICD-10-CM | POA: Diagnosis not present

## 2013-04-12 NOTE — Patient Instructions (Signed)
We will call once surgery is scheduled and give you additional instructions

## 2013-04-12 NOTE — Progress Notes (Signed)
72 y.o.Marriedfemale here for discussion of pelvic ultrasound and recommendations made by Dr. Tresa Res.  Ultrasound performed on 03/01/13 due to PMP bleeding.  Images reviewed with pt today.  Uterus measured 6.5 x 3.5 x. 2.3cm with 5.2 mm endometrial thickness.  Ovaries normal.  Endometrial thickness appears to be due to endometrial polyp.  Patient has many questions about ultrasound findings, polyp, cancer risk, relation to colon polyps.  Answered all questions.    Recommended pt proceed with hysteroscopy, polyp resection, D&C.  Procedure discussed with patient.  Recovery and pain management discussed.  Risks discussed including but not limited to bleeding, rare risk of transfusion, infection, 1% risk of uterine perforation with risks of fluid deficit causing cardiac arrythmia, cerebral swelling and/or need to stop procedure early.  Fluid emboli and rare risk of death discussed.  DVT/PE, rare risk of risk of bowel/bladder/ureteral/vascular injury.  Patient aware if pathology abnormal she may need additional treatment.  All questions answered.    Patient also has quesitons about type of anesthesia.  Tried to address these to the best of my knowledge.  Feel pt would do well with MAC or LMA.    Patient declines endometrial biopsy or [facility] day of ultrasound due to prior experience.  As polyp is most likely benign, feel this is ok and will just wait for final pathology.  She is very grateful.    PMHx, PSurgHx, meds, allergies reviewed with pt.  She is very allergic to contrast dye.  Lidocaine type products cause tachycardia.  D/W pt paracervical block and that tachycardia is not an allergy but side effect.  Again, questions answered.    Assessment:  PMP bleeding, endometrial polyp  Plan: hysteroscopy with polyp resection, D&C will be planned.  Pt prefers 10/21.  ~40 minutes spent with patient >50% of time was in face to face discussion of above.  Very lengthy visit due to patient's many questions and  concerns.

## 2013-04-14 ENCOUNTER — Telehealth: Payer: Self-pay | Admitting: Obstetrics & Gynecology

## 2013-04-14 NOTE — Telephone Encounter (Signed)
Spoke with pt about surgery date. Pt agrees/CME

## 2013-04-20 MED ORDER — ESTROGENS, CONJUGATED 0.625 MG/GM VA CREA: TOPICAL_CREAM | Freq: Every day | VAGINAL | Status: DC

## 2013-04-20 NOTE — Telephone Encounter (Signed)
I did not have Premarin listed so it did not trigger my memory to refill.  It has been sent to pharmacy.

## 2013-04-20 NOTE — Telephone Encounter (Signed)
Refill for premarin cream to [facility] at [phone].

## 2013-04-20 NOTE — Telephone Encounter (Signed)
Please advise 02/24/13 no rx was given.

## 2013-04-20 NOTE — Telephone Encounter (Signed)
LM on patient's VM that her rx has been sent to her pharmacy.

## 2013-04-21 ENCOUNTER — Telehealth: Payer: Self-pay | Admitting: Orthopedic Surgery

## 2013-04-21 ENCOUNTER — Inpatient Hospital Stay (HOSPITAL_COMMUNITY): Admission: RE | Admit: 2013-04-21 | Payer: Medicare Other | Source: Ambulatory Visit

## 2013-04-21 ENCOUNTER — Encounter (HOSPITAL_COMMUNITY): Payer: Self-pay | Admitting: Pharmacist

## 2013-04-21 NOTE — Telephone Encounter (Signed)
LM on pt's VM confirming name that it is fine to stay on the meds we talked about up until surgery. Pt to call back with any questions.

## 2013-04-21 NOTE — Telephone Encounter (Signed)
Message copied by Alfredo Batty on Thu Apr 21, 2013  2:41 PM ------      Message from: Jerene Bears      Created: Wed Apr 20, 2013  3:37 PM      Regarding: RE: meds prior to surgery       Fine to stay on all of these.            MSM      ----- Message -----         From: Alfredo Batty, RN         Sent: 04/19/2013  10:35 AM           To: Annamaria Boots, MD      Subject: meds prior to surgery                                    Pt wanted to make sure it was safe to continue taking estradiol, progesterone, Vit D 2000 IU, and magnesium citrate 150 mg capsule up until her surgery on 05-03-13 [facility], hysteroscopic resection of endo polyp). Please advise.       ------

## 2013-04-26 ENCOUNTER — Encounter (HOSPITAL_COMMUNITY): Payer: Self-pay

## 2013-04-26 ENCOUNTER — Encounter (HOSPITAL_COMMUNITY)
Admission: RE | Admit: 2013-04-26 | Discharge: 2013-04-26 | Disposition: A | Discharge status: Home / Self Care | Payer: Medicare Other | Source: Ambulatory Visit | Attending: Obstetrics & Gynecology | Admitting: Obstetrics & Gynecology

## 2013-04-26 DIAGNOSIS — Z01812 Encounter for preprocedural laboratory examination: Secondary | ICD-10-CM | POA: Insufficient documentation

## 2013-04-26 DIAGNOSIS — Z01810 Encounter for preprocedural cardiovascular examination: Secondary | ICD-10-CM | POA: Insufficient documentation

## 2013-04-26 LAB — CBC
MCH: 31.2 pg (ref 26.0–34.0)
Platelets: 217 10*3/uL (ref 150–400)
RBC: 4.46 MIL/uL (ref 3.87–5.11)
WBC: 5.8 10*3/uL (ref 4.0–10.5)

## 2013-04-26 NOTE — Patient Instructions (Signed)
Your procedure is scheduled on:05/03/13  Enter through the Main Entrance at : 6am Pick up desk phone and dial [phone] and inform us of your arrival.  Please call [phone] if you have any problems the morning of surgery.  Remember: Do not eat food or drink liquids after midnight:Monday water ok until:3:30 am Monday   You may brush your teeth the morning of surgery  DO NOT wear jewelry, eye make-up, lipstick,body lotion, or dark fingernail polish.  (Polished toes are ok) You may wear deodorant.   Patients discharged on the day of surgery will not be allowed to drive home. Wear loose fitting, comfortable clothes for your ride home.

## 2013-05-02 ENCOUNTER — Encounter (HOSPITAL_COMMUNITY): Payer: Self-pay | Admitting: Anesthesiology

## 2013-05-02 NOTE — Anesthesia Preprocedure Evaluation (Addendum)
Anesthesia Evaluation  Patient identified by MRN, date of birth, ID band Patient awake    Reviewed: Allergy & Precautions, H&P , NPO status , Patient's Chart, lab work & pertinent test results  Airway Mallampati: II TM Distance: >3 FB Neck ROM: Full    Dental no notable dental hx. (+) Teeth Intact   Pulmonary neg pulmonary ROS, former smoker,  breath sounds clear to auscultation  Pulmonary exam normal       Cardiovascular negative cardio ROS  Rhythm:Regular Rate:Normal     Neuro/Psych  Headaches, negative psych ROS   GI/Hepatic Neg liver ROS, IBS   Endo/Other  negative endocrine ROS  Renal/GU negative Renal ROS  negative genitourinary   Musculoskeletal negative musculoskeletal ROS (+)   Abdominal Normal abdominal exam  (+)   Peds  Hematology negative hematology ROS (+)   Anesthesia Other Findings   Reproductive/Obstetrics PMB Endometrial Polyp                          Anesthesia Physical Anesthesia Plan  ASA: II  Anesthesia Plan: General   Post-op Pain Management:    Induction: Intravenous  Airway Management Planned: LMA  Additional Equipment:   Intra-op Plan:   Post-operative Plan: Extubation in OR  Informed Consent: I have reviewed the patients History and Physical, chart, labs and discussed the procedure including the risks, benefits and alternatives for the proposed anesthesia with the patient or authorized representative who has indicated his/her understanding and acceptance.   Dental advisory given  Plan Discussed with: CRNA, Anesthesiologist and Surgeon  Anesthesia Plan Comments:         Anesthesia Quick Evaluation

## 2013-05-03 ENCOUNTER — Ambulatory Visit (HOSPITAL_BASED_OUTPATIENT_CLINIC_OR_DEPARTMENT_OTHER): Admit: 2013-05-03 | Payer: Medicare Other | Admitting: Obstetrics & Gynecology

## 2013-05-03 ENCOUNTER — Encounter (HOSPITAL_BASED_OUTPATIENT_CLINIC_OR_DEPARTMENT_OTHER): Payer: Self-pay

## 2013-05-03 ENCOUNTER — Ambulatory Visit (HOSPITAL_COMMUNITY)
Admission: RE | Admit: 2013-05-03 | Discharge: 2013-05-03 | Disposition: A | Discharge status: Home / Self Care | Payer: Medicare Other | Source: Ambulatory Visit | Attending: Obstetrics & Gynecology | Admitting: Obstetrics & Gynecology

## 2013-05-03 ENCOUNTER — Other Ambulatory Visit (HOSPITAL_COMMUNITY): Payer: Medicare Other

## 2013-05-03 ENCOUNTER — Encounter (HOSPITAL_COMMUNITY): Payer: Medicare Other | Admitting: Anesthesiology

## 2013-05-03 ENCOUNTER — Encounter (HOSPITAL_COMMUNITY)
Admission: RE | Disposition: A | Discharge status: Home / Self Care | Payer: Self-pay | Source: Ambulatory Visit | Attending: Obstetrics & Gynecology

## 2013-05-03 ENCOUNTER — Ambulatory Visit (HOSPITAL_COMMUNITY): Payer: Medicare Other | Admitting: Anesthesiology

## 2013-05-03 ENCOUNTER — Encounter (HOSPITAL_COMMUNITY): Payer: Self-pay | Admitting: *Deleted

## 2013-05-03 ENCOUNTER — Telehealth: Payer: Self-pay | Admitting: Obstetrics & Gynecology

## 2013-05-03 DIAGNOSIS — N840 Polyp of corpus uteri: Secondary | ICD-10-CM | POA: Insufficient documentation

## 2013-05-03 DIAGNOSIS — N950 Postmenopausal bleeding: Secondary | ICD-10-CM | POA: Insufficient documentation

## 2013-05-03 HISTORY — PX: DILATATION & CURRETTAGE/HYSTEROSCOPY WITH RESECTOCOPE: SHX5572

## 2013-05-03 SURGERY — DILATATION & CURETTAGE/HYSTEROSCOPY WITH RESECTOCOPE
Anesthesia: General | Site: Uterus | Wound class: Clean Contaminated

## 2013-05-03 SURGERY — DILATATION & CURETTAGE/HYSTEROSCOPY WITH RESECTOCOPE
Anesthesia: Choice

## 2013-05-03 MED ORDER — HYDROMORPHONE HCL 2 MG PO TABS: 2.0000 mg | ORAL_TABLET | Freq: Four times a day (QID) | ORAL | Status: DC | PRN

## 2013-05-03 MED ORDER — ONDANSETRON HCL 4 MG/2ML IJ SOLN
INTRAMUSCULAR | Status: AC
  Filled 2013-05-03: qty 2

## 2013-05-03 MED ORDER — LIDOCAINE HCL (CARDIAC) 20 MG/ML IV SOLN
INTRAVENOUS | Status: AC
  Filled 2013-05-03: qty 5

## 2013-05-03 MED ORDER — LACTATED RINGERS IV SOLN
INTRAVENOUS | Status: DC
  Administered 2013-05-03 (×2): via INTRAVENOUS

## 2013-05-03 MED ORDER — GLYCINE 1.5 % IR SOLN
Status: DC | PRN
  Administered 2013-05-03: 3000 mL

## 2013-05-03 MED ORDER — LIDOCAINE HCL 1 % IJ SOLN
INTRAMUSCULAR | Status: AC
  Filled 2013-05-03: qty 20

## 2013-05-03 MED ORDER — ACETAMINOPHEN 325 MG PO TABS: 650.0000 mg | ORAL_TABLET | ORAL | Status: DC | PRN

## 2013-05-03 MED ORDER — KETOROLAC TROMETHAMINE 30 MG/ML IJ SOLN
INTRAMUSCULAR | Status: DC | PRN
  Administered 2013-05-03: 15 mg via INTRAVENOUS

## 2013-05-03 MED ORDER — DEXAMETHASONE SODIUM PHOSPHATE 10 MG/ML IJ SOLN
INTRAMUSCULAR | Status: DC | PRN
  Administered 2013-05-03: 5 mg via INTRAVENOUS

## 2013-05-03 MED ORDER — EPHEDRINE 5 MG/ML INJ
INTRAVENOUS | Status: AC
  Filled 2013-05-03: qty 10

## 2013-05-03 MED ORDER — FENTANYL CITRATE 0.05 MG/ML IJ SOLN
INTRAMUSCULAR | Status: AC
  Filled 2013-05-03: qty 2

## 2013-05-03 MED ORDER — ONDANSETRON HCL 4 MG/2ML IJ SOLN
INTRAMUSCULAR | Status: DC | PRN
  Administered 2013-05-03: 4 mg via INTRAVENOUS

## 2013-05-03 MED ORDER — LIDOCAINE HCL 1 % IJ SOLN
INTRAMUSCULAR | Status: DC | PRN
  Administered 2013-05-03: 10 mL

## 2013-05-03 MED ORDER — KETOROLAC TROMETHAMINE 15 MG/ML IJ SOLN
15.0000 mg | Freq: Once | INTRAMUSCULAR | Status: AC
  Administered 2013-05-03: 15 mg via INTRAVENOUS
  Filled 2013-05-03: qty 1

## 2013-05-03 MED ORDER — DEXAMETHASONE SODIUM PHOSPHATE 10 MG/ML IJ SOLN
INTRAMUSCULAR | Status: AC
  Filled 2013-05-03: qty 1

## 2013-05-03 MED ORDER — KETOROLAC TROMETHAMINE 30 MG/ML IJ SOLN
INTRAMUSCULAR | Status: AC
  Filled 2013-05-03: qty 1

## 2013-05-03 MED ORDER — ACETAMINOPHEN 650 MG RE SUPP
650.0000 mg | RECTAL | Status: DC | PRN
  Filled 2013-05-03: qty 1

## 2013-05-03 MED ORDER — FENTANYL CITRATE 0.05 MG/ML IJ SOLN
INTRAMUSCULAR | Status: AC
  Filled 2013-05-03: qty 5

## 2013-05-03 MED ORDER — MIDAZOLAM HCL 2 MG/2ML IJ SOLN
INTRAMUSCULAR | Status: AC
  Filled 2013-05-03: qty 2

## 2013-05-03 MED ORDER — FENTANYL CITRATE 0.05 MG/ML IJ SOLN
INTRAMUSCULAR | Status: DC | PRN
  Administered 2013-05-03: 50 ug via INTRAVENOUS

## 2013-05-03 MED ORDER — KETOROLAC TROMETHAMINE 15 MG/ML IJ SOLN
15.0000 mg | Freq: Once | INTRAMUSCULAR | Status: DC
  Filled 2013-05-03: qty 1

## 2013-05-03 MED ORDER — CEFOTETAN DISODIUM 2 G IJ SOLR
2.0000 g | INTRAMUSCULAR | Status: AC
  Administered 2013-05-03: 2 g via INTRAVENOUS
  Filled 2013-05-03: qty 2

## 2013-05-03 MED ORDER — LIDOCAINE HCL (CARDIAC) 20 MG/ML IV SOLN
INTRAVENOUS | Status: DC | PRN
  Administered 2013-05-03: 50 mg via INTRAVENOUS

## 2013-05-03 MED ORDER — METOCLOPRAMIDE HCL 5 MG/ML IJ SOLN: 10.0000 mg | Freq: Once | INTRAMUSCULAR | Status: DC | PRN

## 2013-05-03 MED ORDER — FENTANYL CITRATE 0.05 MG/ML IJ SOLN
25.0000 ug | INTRAMUSCULAR | Status: DC | PRN
  Administered 2013-05-03: 50 ug via INTRAVENOUS

## 2013-05-03 MED ORDER — SILVER NITRATE-POT NITRATE 75-25 % EX MISC
CUTANEOUS | Status: DC | PRN
  Administered 2013-05-03: 2

## 2013-05-03 MED ORDER — EPHEDRINE SULFATE 50 MG/ML IJ SOLN
INTRAMUSCULAR | Status: DC | PRN
  Administered 2013-05-03: 5 mg via INTRAVENOUS

## 2013-05-03 MED ORDER — LIDOCAINE-EPINEPHRINE 1 %-1:100000 IJ SOLN
INTRAMUSCULAR | Status: AC
  Filled 2013-05-03: qty 1

## 2013-05-03 MED ORDER — MEPERIDINE HCL 25 MG/ML IJ SOLN: 6.2500 mg | INTRAMUSCULAR | Status: DC | PRN

## 2013-05-03 MED ORDER — PROPOFOL 10 MG/ML IV BOLUS
INTRAVENOUS | Status: DC | PRN
  Administered 2013-05-03: 140 mg via INTRAVENOUS
  Administered 2013-05-03: 20 mg via INTRAVENOUS

## 2013-05-03 MED ORDER — PROPOFOL 10 MG/ML IV EMUL
INTRAVENOUS | Status: AC
  Filled 2013-05-03: qty 20

## 2013-05-03 MED ORDER — ONDANSETRON HCL 4 MG/2ML IJ SOLN: 4.0000 mg | Freq: Four times a day (QID) | INTRAMUSCULAR | Status: DC | PRN

## 2013-05-03 SURGICAL SUPPLY — 17 items
CANISTER SUCT 3000ML (MISCELLANEOUS) ×2 IMPLANT
CATH ROBINSON RED A/P 16FR (CATHETERS) ×2 IMPLANT
CLOTH BEACON ORANGE TIMEOUT ST (SAFETY) ×2 IMPLANT
CONTAINER PREFILL 10% NBF 60ML (FORM) ×3 IMPLANT
DILATOR CANAL MILEX (MISCELLANEOUS) IMPLANT
DRESSING TELFA 8X3 (GAUZE/BANDAGES/DRESSINGS) ×2 IMPLANT
ELECT REM PT RETURN 9FT ADLT (ELECTROSURGICAL) ×2
ELECTRODE REM PT RTRN 9FT ADLT (ELECTROSURGICAL) IMPLANT
GLOVE BIOGEL PI IND STRL 7.0 (GLOVE) ×1 IMPLANT
GLOVE BIOGEL PI INDICATOR 7.0 (GLOVE) ×2
GLOVE ECLIPSE 6.5 STRL STRAW (GLOVE) ×6 IMPLANT
GOWN STRL REIN XL XLG (GOWN DISPOSABLE) ×4 IMPLANT
LOOP ANGLED CUTTING 22FR (CUTTING LOOP) ×1 IMPLANT
PACK HYSTEROSCOPY LF (CUSTOM PROCEDURE TRAY) ×2 IMPLANT
PAD OB MATERNITY 4.3X12.25 (PERSONAL CARE ITEMS) ×2 IMPLANT
TOWEL OR 17X24 6PK STRL BLUE (TOWEL DISPOSABLE) ×4 IMPLANT
WATER STERILE IRR 1000ML POUR (IV SOLUTION) ×1 IMPLANT

## 2013-05-03 NOTE — Telephone Encounter (Signed)
Patient notified as directed by Dr Hyacinth Meeker. Instructed to call back if Imitrex does not help.

## 2013-05-03 NOTE — Transfer of Care (Signed)
Immediate Anesthesia Transfer of Care Note  Patient: Cindy Valencia  Procedure(s) Performed: Procedure(s): DILATATION & CURETTAGE/HYSTEROSCOPY WITH RESECTOCOPE (N/A)  Patient Location: PACU  Anesthesia Type:General  Level of Consciousness: awake  Airway & Oxygen Therapy: Patient Spontanous Breathing  Post-op Assessment: Report given to PACU RN  Post vital signs: stable  Filed Vitals:   05/03/13 0608  BP: 112/49  Pulse: 62  Temp: 36.5 C  Resp: 18    Complications: No apparent anesthesia complications

## 2013-05-03 NOTE — H&P (Signed)
Cindy Valencia is an 72 y.o. female G2P2 here for hysteroscopy and polyp resection with D&C.  This was found via ultrasound after patient had episode of PMP bleeding.  Pertinent Gynecological History: Menses: post-menopausal Bleeding: post menopausal bleeding Contraception: post menopausal status DES exposure: denies Blood transfusions: none Sexually transmitted diseases: no past history Previous GYN Procedures: none  Last mammogram: normal Date: 8/14 Last pap: normal Date: 8/14 OB History: G2, P2   Menstrual History: Patient's last menstrual period was 03/01/2013.    Past Medical History  Diagnosis Date  . Migraine headache   . Colon polyp 6/09  . IBS (irritable bowel syndrome)   . Skin cancer   . Lipoma 2000    right upper chest wall  . Skin cancer   . Endometrial polyp 12/07    Past Surgical History  Procedure Laterality Date  . Tonsillectomy and adenoidectomy    . Mole removal    . Vaginal delivery      x2     Family History  Problem Relation Age of Onset  . Lung cancer Father   . Heart disease Father   . Heart attack Mother   . Osteoarthritis Mother   . Heart disease Maternal Grandfather   . Heart disease Maternal Grandmother   . Osteoarthritis Sister     Social History:  reports that she has quit smoking. She has never used smokeless tobacco. She reports that she does not drink alcohol or use illicit drugs.  Allergies:  Allergies  Allergen Reactions  . Contrast Media [Iodinated Diagnostic Agents] Anaphylaxis    And hives and throat closed  . Codeine     REACTION: nausea  . Epinephrine Palpitations    Increased heart rate    Prescriptions prior to admission  Medication Sig Dispense Refill  . Biotin 5000 MCG CAPS Take 5,000 mcg by mouth every evening.      . conjugated estrogens (PREMARIN) vaginal cream Place vaginally daily. Use 1/2 g vaginally twice weekly  42.5 g  3  . EVENING PRIMROSE OIL PO Take 1,300 mg by mouth 2 (two) times daily.      .  fish oil-omega-3 fatty acids 1000 MG capsule Take 1 g by mouth daily.      . Flaxseed, Linseed, (GROUND FLAX SEEDS PO) Take 1 capsule by mouth every morning.      . fluticasone (FLONASE) 50 MCG/ACT nasal spray Place 2 sprays into the nose daily as needed for allergies.       Marland Kitchen MAGNESIUM CITRATE PO Take 150 mg by mouth every evening.       . Multiple Vitamin (MULTIVITAMIN WITH MINERALS) TABS tablet Take 1 tablet by mouth daily.      . NONFORMULARY OR COMPOUNDED ITEM Estradiol 0.5mg  and Progesterone 75 mg capsule.  One daily.  90 each  3  . OVER THE COUNTER MEDICATION Take 1 tablet by mouth daily. 500 mg Reservatrol      . OVER THE COUNTER MEDICATION Take 2 tablets by mouth every morning. Ambertoes (vitamin)      . OVER THE COUNTER MEDICATION Take 1-2 tablets by mouth 2 (two) times daily. Dark Cherry (vitamin) 1 in AM and 2 in PM      . VITAMIN D, CHOLECALCIFEROL, PO Take 2,000 mg by mouth daily.         Review of Systems  All other systems reviewed and are negative.    Blood pressure 112/49, pulse 62, temperature 97.7 F (36.5 C), temperature source Oral, resp.  rate 18, last menstrual period 03/01/2013, SpO2 99.00%. Physical Exam  Constitutional: She appears well-developed and well-nourished.  Neck: Normal range of motion. Neck supple.  Cardiovascular: Normal rate and regular rhythm.   Respiratory: Effort normal and breath sounds normal.  GI: Soft. Bowel sounds are normal.  Skin: Skin is warm and dry.  Psychiatric: She has a normal mood and affect.    No results found for this or any previous visit (from the past 24 hour(s)).  No results found.  Assessment/Plan: 31 MWF here for hysteroscopic polyp resection and D&C.  Risks and benefits discussed with patient in office and are documented in prior note.  Procedure reviewed.  All questions answered.  No family with patient today.  Husband went to gym and I have his contact number.  Valentina Shaggy SUZANNE 05/03/2013, 7:01 AM

## 2013-05-03 NOTE — Op Note (Signed)
05/03/2013  8:20 AM  PATIENT:  Cindy Valencia  72 y.o. female  PRE-OPERATIVE DIAGNOSIS:  PMB ENDOMETRIAL POLYP  POST-OPERATIVE DIAGNOSIS:  PMB ENDOMETRIAL POLYP  PROCEDURE:  Procedure(s): DILATATION & CURETTAGE/HYSTEROSCOPY WITH RESECTOCOPE  SURGEON:  Atilla Zollner SUZANNE  ASSISTANTS: OR staff   ANESTHESIA:   general , LMA, Dr. Rodman Pickle oversaw case  ESTIMATED BLOOD LOSS: 10 cc  BLOOD ADMINISTERED:none   FLUIDS: 1300cc LR  UOP: 75cc clear UOP drained at beginning of case  SPECIMEN:  Endometrial curretings and endometrial and endocervical polyps  DISPOSITION OF SPECIMEN:  PATHOLOGY  FINDINGS: Endometrial polyp with wide base anteriorly within endometrial cavity, endocervical polyp, thin endometrium, normal tubal ostia  DESCRIPTION OF OPERATION: Patient was taken to the operating room.  She is placed in the supine position. SCDs were on her lower extremities and functioning properly. General anesthesia with an LMA was administered without difficulty.   Legs were then placed in the [facility] stirrups in the low lithotomy position. The legs were lifted to the high lithotomy position and skin prep was used on the inner thighs perineum and vagina x3.  Betadine was not used due to pt's contrast allergy.  Patient was draped in a normal standard fashion. An in and out catheterization with a red rubber Foley catheter was performed. Approximately 75 cc of clear urine was noted. A bivalve speculum was placed the vagina. The anterior lip of the cervix was grasped with single-tooth tenaculum.  A paracervical block of 1% lidocaine was used total, 10cc's total injected. The cervix is dilated up to #21 [facility] dilators. The endometrial cavity sounded to 7 cm.   A 2.9 millimeter diagnostic hysteroscope was obtained. 1.5% glycine was used as a hysteroscopic fluid. The hysteroscope was advanced through the endocervical canal into the endometrial cavity. The tubal ostium on the right wat noted.  The left  could not be seen due to the polyp.  Photodocumentation was made.  Due to the wide base of the polyp, decision was made to use the resectoscope.  Cervix was dilated up to a #31 Pratt dilator.  The resectoscope was then used to resect the polyp at the base.  At the scope was withdrawn, a second endocervical polyp was noted.  The resectoscope on cut setting was used to resect this polyp as well.  The hysteroscope was then removed. A #1 toothed curette was used to curette the cavity until rough grey tissue is noted in all quadrants. The hysteroscope was used to revisualized the endometrial cavity.  Photodocumentation was made.  Full resection of the polyp was noted.  At this point the procedure was ended.  The fluid deficit was 75 cc. The tenaculum was removed from the anterior lip of the cervix. Silver nitrate sticks were used for excellent hemostasis.  The speculum was removed from the vagina. The prep was cleansed of the patient's skin. The legs are positioned back in the supine position. Sponge, lap, needle, initially counts were correct x2. Patient was taken to recovery in stable condition.  COUNTS:  YES  PLAN OF CARE: Transfer to PACU

## 2013-05-03 NOTE — Telephone Encounter (Signed)
Yes.  Imitrex ok.

## 2013-05-03 NOTE — Anesthesia Postprocedure Evaluation (Signed)
  Anesthesia Post-op Note  Anesthesia Post Note  Patient: Cindy Valencia  Procedure(s) Performed: Procedure(s) (LRB): DILATATION & CURETTAGE/HYSTEROSCOPY WITH RESECTOCOPE (N/A)  Anesthesia type: General  Patient location: PACU  Post pain: Pain level controlled  Post assessment: Post-op Vital signs reviewed  Last Vitals:  Filed Vitals:   05/03/13 0945  BP:   Pulse: 50  Temp:   Resp: 16    Post vital signs: Reviewed  Level of consciousness: sedated  Complications: No apparent anesthesia complications

## 2013-05-03 NOTE — Telephone Encounter (Signed)
pt states she had a procedure today and has some questions states it is urgent

## 2013-05-03 NOTE — Preoperative (Signed)
Beta Blockers   Reason not to administer Beta Blockers:Not Applicable 

## 2013-05-03 NOTE — Telephone Encounter (Signed)
Patient complaining of bad headache and wants to know if ok to take Imitrex for it.  Had surgery today and this headache is typical of her headaches.  Has not had any medication since discharged.

## 2013-05-04 ENCOUNTER — Telehealth: Payer: Self-pay | Admitting: *Deleted

## 2013-05-04 ENCOUNTER — Encounter (HOSPITAL_COMMUNITY): Payer: Self-pay | Admitting: Obstetrics & Gynecology

## 2013-05-04 NOTE — Telephone Encounter (Signed)
Follow-up call to patient.  She states Dr Hyacinth Meeker "must have done a great job because I never had cramp one".  She states she thinks she waited a little too long before taking Imitrex yesterday and as a result she had to take two to get relief.  She reports headache is gone today and she feels fine.  A little slow getting started but no complaints and minimal bleeding.  Advised to call back if needed.  Patient wonders if headache may have been triggered by a specific med from yesterday or just the anesthesia/surgery in general since she is prone to headaches.  She states she will discuss this with Dr Hyacinth Meeker at post op.

## 2013-05-04 NOTE — Telephone Encounter (Signed)
Message copied by Alisa Graff on Wed May 04, 2013 10:07 AM ------      Message from: Jerene Bears      Created: Wed May 04, 2013  9:47 AM       Can you check on pt?  Post op with headache yesterday?            MSM ------

## 2013-05-19 ENCOUNTER — Other Ambulatory Visit: Payer: Self-pay

## 2013-05-20 ENCOUNTER — Ambulatory Visit (INDEPENDENT_AMBULATORY_CARE_PROVIDER_SITE_OTHER): Payer: Medicare Other | Admitting: Obstetrics & Gynecology

## 2013-05-20 ENCOUNTER — Encounter: Payer: Self-pay | Admitting: Obstetrics & Gynecology

## 2013-05-20 VITALS — BP 102/64 | HR 60 | Resp 16 | Ht 61.25 in | Wt 112.6 lb

## 2013-05-20 DIAGNOSIS — Z9889 Other specified postprocedural states: Secondary | ICD-10-CM

## 2013-05-20 NOTE — Patient Instructions (Signed)
Please call with any new problems/issues. 

## 2013-05-20 NOTE — Progress Notes (Signed)
Post Operative Visit  Procedure: Hysteroscopy, polyp resection, D&C Days Post-op: 16  Subjective: Doing really well.  Had spotting/discharge for about 2 weeks.  Stopped three or four days ago.  Reviewed pathology (benign endometrial polyp and endocervical polyp).  Had no pain.  Really pleased with the way she felt after surgery.    Objective: BP 102/64  Pulse 60  Resp 16  Ht 5' 1.25" (1.556 m)  Wt 112 lb 9.6 oz (51.075 kg)  BMI 21.10 kg/m2  LMP 03/01/2013  EXAM General: alert and cooperative GI: soft, non-tender; bowel sounds normal; no masses,  no organomegaly Extremities: extremities normal, atraumatic, no cyanosis or edema Vaginal Bleeding: none Gyn:  Vagina without discharge, cervix closed, uterus non tender  Assessment: s/p hysteroscopy, polyp resection, D&C  Plan: AEX 8/15 with Shirlyn Goltz

## 2013-05-23 DIAGNOSIS — D1801 Hemangioma of skin and subcutaneous tissue: Secondary | ICD-10-CM | POA: Diagnosis not present

## 2013-05-23 DIAGNOSIS — D485 Neoplasm of uncertain behavior of skin: Secondary | ICD-10-CM | POA: Diagnosis not present

## 2013-05-23 DIAGNOSIS — D2330 Other benign neoplasm of skin of unspecified part of face: Secondary | ICD-10-CM | POA: Diagnosis not present

## 2013-05-23 DIAGNOSIS — Z85828 Personal history of other malignant neoplasm of skin: Secondary | ICD-10-CM | POA: Diagnosis not present

## 2013-05-23 DIAGNOSIS — L821 Other seborrheic keratosis: Secondary | ICD-10-CM | POA: Diagnosis not present

## 2013-05-23 DIAGNOSIS — C44319 Basal cell carcinoma of skin of other parts of face: Secondary | ICD-10-CM | POA: Diagnosis not present

## 2013-05-23 DIAGNOSIS — L570 Actinic keratosis: Secondary | ICD-10-CM | POA: Diagnosis not present

## 2013-05-23 DIAGNOSIS — D239 Other benign neoplasm of skin, unspecified: Secondary | ICD-10-CM | POA: Diagnosis not present

## 2013-06-20 DIAGNOSIS — Z0000 Encounter for general adult medical examination without abnormal findings: Secondary | ICD-10-CM | POA: Diagnosis not present

## 2013-06-20 DIAGNOSIS — E785 Hyperlipidemia, unspecified: Secondary | ICD-10-CM | POA: Diagnosis not present

## 2013-06-30 DIAGNOSIS — M545 Low back pain: Secondary | ICD-10-CM | POA: Diagnosis not present

## 2013-06-30 DIAGNOSIS — J309 Allergic rhinitis, unspecified: Secondary | ICD-10-CM | POA: Diagnosis not present

## 2013-06-30 DIAGNOSIS — H9190 Unspecified hearing loss, unspecified ear: Secondary | ICD-10-CM | POA: Diagnosis not present

## 2013-06-30 DIAGNOSIS — Z1331 Encounter for screening for depression: Secondary | ICD-10-CM | POA: Diagnosis not present

## 2013-06-30 DIAGNOSIS — Z0000 Encounter for general adult medical examination without abnormal findings: Secondary | ICD-10-CM | POA: Diagnosis not present

## 2013-06-30 DIAGNOSIS — E785 Hyperlipidemia, unspecified: Secondary | ICD-10-CM | POA: Diagnosis not present

## 2013-06-30 DIAGNOSIS — R51 Headache: Secondary | ICD-10-CM | POA: Diagnosis not present

## 2013-06-30 DIAGNOSIS — Z23 Encounter for immunization: Secondary | ICD-10-CM | POA: Diagnosis not present

## 2013-06-30 DIAGNOSIS — J449 Chronic obstructive pulmonary disease, unspecified: Secondary | ICD-10-CM | POA: Diagnosis not present

## 2013-06-30 DIAGNOSIS — M549 Dorsalgia, unspecified: Secondary | ICD-10-CM | POA: Diagnosis not present

## 2013-07-01 DIAGNOSIS — Z1212 Encounter for screening for malignant neoplasm of rectum: Secondary | ICD-10-CM | POA: Diagnosis not present

## 2013-07-19 DIAGNOSIS — C44319 Basal cell carcinoma of skin of other parts of face: Secondary | ICD-10-CM | POA: Diagnosis not present

## 2013-07-19 DIAGNOSIS — Z85828 Personal history of other malignant neoplasm of skin: Secondary | ICD-10-CM | POA: Diagnosis not present

## 2013-08-01 NOTE — Telephone Encounter (Signed)
Had post op visit 05-20-13.   To provider for sign off. Encounter closed.

## 2013-08-05 ENCOUNTER — Telehealth: Payer: Self-pay | Admitting: *Deleted

## 2013-08-05 NOTE — Telephone Encounter (Signed)
Incoming fax requesting Estradiol/Progesterone 0.5-75 mg capsule.  Last AEX 02/24/2013. Refill 10/28/2012 #30/4 refills. ( Marked as Estradiol in epic. Send to pharmacy as a comment)  Next Appt 02/28/2014.  Please advise.

## 2013-08-08 ENCOUNTER — Telehealth: Payer: Self-pay | Admitting: Obstetrics & Gynecology

## 2013-08-08 MED ORDER — NONFORMULARY OR COMPOUNDED ITEM: Status: DC

## 2013-08-08 NOTE — Telephone Encounter (Signed)
Spoke with pt who had a colonoscopy in 2009 with Dr. Henrene Pastor and was told to repeat in 5 years as she had polyps. Pt never even got to talk to Dr. Henrene Pastor and would rather see Dr. Collene Mares, who SM spoke so highly of. Advised I would call Dr. Lorie Apley office and inquire about an appt. Pt prefers no Wed or Fri afternoon appts.   Call to Dr. Lorie Apley office. Pt would just need to sign a release at Dr. Blanch Media office to get her records, then Dr. Collene Mares will review them and they will call to schedule.   Call to pt to let her know. Pt will go by Dr. Blanch Media office next week to sign form as she is leaving town Architectural technologist. Pt appreciative. Will send records from our office to Dr. Collene Mares.

## 2013-08-08 NOTE — Telephone Encounter (Signed)
Pharmacy calling to check on refill

## 2013-08-08 NOTE — Telephone Encounter (Signed)
Patient calling to find out name of who we referred her to for a colonoscopy. She has not been and wants to get this scheduled.

## 2013-08-08 NOTE — Telephone Encounter (Signed)
Ok to have combination compounded estradiol and progesterone called to [facility]

## 2013-08-08 NOTE — Telephone Encounter (Signed)
Patient calling to check on status of this refill as she is leaving tomorrow.

## 2013-08-08 NOTE — Telephone Encounter (Signed)
Message left by Abelino Derrick, CMA that RX has been sent to pharmacy.  Pt to call with any other questions/concerns.

## 2013-08-16 DIAGNOSIS — H903 Sensorineural hearing loss, bilateral: Secondary | ICD-10-CM | POA: Diagnosis not present

## 2013-08-16 DIAGNOSIS — H9319 Tinnitus, unspecified ear: Secondary | ICD-10-CM | POA: Diagnosis not present

## 2013-08-16 DIAGNOSIS — H905 Unspecified sensorineural hearing loss: Secondary | ICD-10-CM | POA: Diagnosis not present

## 2013-08-24 DIAGNOSIS — H2589 Other age-related cataract: Secondary | ICD-10-CM | POA: Diagnosis not present

## 2013-08-24 DIAGNOSIS — H35379 Puckering of macula, unspecified eye: Secondary | ICD-10-CM | POA: Diagnosis not present

## 2013-08-24 DIAGNOSIS — H5210 Myopia, unspecified eye: Secondary | ICD-10-CM | POA: Diagnosis not present

## 2013-08-29 DIAGNOSIS — G44219 Episodic tension-type headache, not intractable: Secondary | ICD-10-CM | POA: Diagnosis not present

## 2013-08-29 DIAGNOSIS — G43009 Migraine without aura, not intractable, without status migrainosus: Secondary | ICD-10-CM | POA: Diagnosis not present

## 2013-09-06 DIAGNOSIS — Z1211 Encounter for screening for malignant neoplasm of colon: Secondary | ICD-10-CM | POA: Diagnosis not present

## 2013-09-06 DIAGNOSIS — Z86010 Personal history of colonic polyps: Secondary | ICD-10-CM | POA: Diagnosis not present

## 2013-09-06 DIAGNOSIS — K648 Other hemorrhoids: Secondary | ICD-10-CM | POA: Diagnosis not present

## 2013-10-24 DIAGNOSIS — L820 Inflamed seborrheic keratosis: Secondary | ICD-10-CM | POA: Diagnosis not present

## 2013-10-24 DIAGNOSIS — L570 Actinic keratosis: Secondary | ICD-10-CM | POA: Diagnosis not present

## 2013-10-24 DIAGNOSIS — Z85828 Personal history of other malignant neoplasm of skin: Secondary | ICD-10-CM | POA: Diagnosis not present

## 2013-10-24 DIAGNOSIS — D485 Neoplasm of uncertain behavior of skin: Secondary | ICD-10-CM | POA: Diagnosis not present

## 2013-10-25 DIAGNOSIS — H2510 Age-related nuclear cataract, unspecified eye: Secondary | ICD-10-CM | POA: Diagnosis not present

## 2013-10-25 DIAGNOSIS — H524 Presbyopia: Secondary | ICD-10-CM | POA: Diagnosis not present

## 2013-10-25 DIAGNOSIS — H35379 Puckering of macula, unspecified eye: Secondary | ICD-10-CM | POA: Diagnosis not present

## 2014-02-01 ENCOUNTER — Encounter: Payer: Self-pay | Admitting: Internal Medicine

## 2014-02-28 ENCOUNTER — Ambulatory Visit (INDEPENDENT_AMBULATORY_CARE_PROVIDER_SITE_OTHER): Payer: Medicare Other | Admitting: Nurse Practitioner

## 2014-02-28 ENCOUNTER — Encounter: Payer: Self-pay | Admitting: Nurse Practitioner

## 2014-02-28 VITALS — BP 104/70 | HR 56 | Ht 61.0 in | Wt 115.0 lb

## 2014-02-28 DIAGNOSIS — Z79890 Hormone replacement therapy (postmenopausal): Secondary | ICD-10-CM | POA: Diagnosis not present

## 2014-02-28 DIAGNOSIS — Z01419 Encounter for gynecological examination (general) (routine) without abnormal findings: Secondary | ICD-10-CM | POA: Diagnosis not present

## 2014-02-28 DIAGNOSIS — N840 Polyp of corpus uteri: Secondary | ICD-10-CM

## 2014-02-28 DIAGNOSIS — Z124 Encounter for screening for malignant neoplasm of cervix: Secondary | ICD-10-CM | POA: Diagnosis not present

## 2014-02-28 MED ORDER — ESTROGENS, CONJUGATED 0.625 MG/GM VA CREA: TOPICAL_CREAM | Freq: Every day | VAGINAL | Status: DC

## 2014-02-28 NOTE — Patient Instructions (Signed)

## 2014-02-28 NOTE — Progress Notes (Signed)
73 y.o. G2P2 Married Caucasian Fe here for annual exam.  No further vaginal bleeding.  Hysteroscopy with removal of endo and cervical polyp on 05/03/13. same dose of Bio identical HRT since 2007.  On HRT since age 73. She tried to come down on her dose of HRT in 2013 and did not feel well.  Patient's last menstrual period was 03/01/2013.          Sexually active: Yes.    The current method of family planning is post menopausal status.    Exercising: Yes.    Home exercise routine includes cardio and free weights. Smoker:  no  Health Maintenance: Pap:  02/24/2013 Neg HR HPV MMG:  02/23/13  Bi-Rads neg Colonoscopy:  12/14/2007 BMD:   07/2009 T score: spine 0.5/left neck -1.3 TDaP:  2010 Labs: PCP maintains all labs and urine   reports that she has quit smoking. She has never used smokeless tobacco. She reports that she does not drink alcohol or use illicit drugs.  Past Medical History  Diagnosis Date  . Migraine headache   . Colon polyp 6/09  . IBS (irritable bowel syndrome)   . Skin cancer   . Lipoma 2000    right upper chest wall  . Skin cancer   . Endometrial polyp 12/07    Past Surgical History  Procedure Laterality Date  . Tonsillectomy and adenoidectomy    . Mole removal    . Vaginal delivery      x2   . Dilatation & currettage/hysteroscopy with resectocope N/A 05/03/2013    Procedure: [place];  Surgeon: Lyman Speller, MD;  Location: [facility];  Service: Gynecology;  Laterality: N/A;    Current Outpatient Prescriptions  Medication Sig Dispense Refill  . Biotin 5000 MCG CAPS Take 5,000 mcg by mouth every evening.      . conjugated estrogens (PREMARIN) vaginal cream Place vaginally daily. Use 1/2 g vaginally twice weekly  42.5 g  3  . EVENING PRIMROSE OIL PO Take 1,300 mg by mouth 2 (two) times daily.      . fish oil-omega-3 fatty acids 1000 MG capsule Take 1 g by mouth daily.      . Flaxseed, Linseed, (GROUND FLAX SEEDS PO)  Take 1 capsule by mouth every morning.      . fluticasone (FLONASE) 50 MCG/ACT nasal spray Place 2 sprays into the nose daily as needed for allergies.       Marland Kitchen MAGNESIUM CITRATE PO Take 150 mg by mouth every evening.       . Multiple Vitamin (MULTIVITAMIN WITH MINERALS) TABS tablet Take 1 tablet by mouth daily.      . NONFORMULARY OR COMPOUNDED ITEM Estradiol 0.5mg  and Progesterone 75 mg capsule.  One daily.  90 each  2  . OVER THE COUNTER MEDICATION Take 1 tablet by mouth daily. 500 mg Reservatrol      . OVER THE COUNTER MEDICATION Take 1-2 tablets by mouth 2 (two) times daily. Dark Cherry (vitamin) 1 in AM and 2 in PM      . VITAMIN D, CHOLECALCIFEROL, PO Take 2,000 mg by mouth daily.        No current facility-administered medications for this visit.    Family History  Problem Relation Age of Onset  . Lung cancer Father   . Heart disease Father   . Heart attack Mother   . Osteoarthritis Mother   . Heart disease Maternal Grandfather   . Heart disease Maternal Grandmother   .  Osteoarthritis Sister     ROS:  Pertinent items are noted in HPI.  Otherwise, a comprehensive ROS was negative.  Exam:   BP 104/70  Pulse 56  Ht 5\' 1"  (1.549 m)  Wt 115 lb (52.164 kg)  BMI 21.74 kg/m2  LMP 03/01/2013 Height: 5\' 1"  (154.9 cm)  Ht Readings from Last 3 Encounters:  02/28/14 5\' 1"  (1.549 m)  05/20/13 5' 1.25" (1.556 m)  04/26/13 5' 1.5" (1.562 m)    General appearance: alert, cooperative and appears stated age Head: Normocephalic, without obvious abnormality, atraumatic Neck: no adenopathy, supple, symmetrical, trachea midline and thyroid normal to inspection and palpation Lungs: clear to auscultation bilaterally Breasts: normal appearance, no masses or tenderness Heart: regular rate and rhythm Abdomen: soft, non-tender; no masses,  no organomegaly Extremities: extremities normal, atraumatic, no cyanosis or edema Skin: Skin color, texture, turgor normal. No rashes or lesions Lymph  nodes: Cervical, supraclavicular, and axillary nodes normal. No abnormal inguinal nodes palpated Neurologic: Grossly normal   Pelvic: External genitalia:  no lesions              Urethra:  normal appearing urethra with no masses, tenderness or lesions              Bartholin's and Skene's: normal                 Vagina: normal appearing vagina with normal color and discharge, no lesions              Cervix: anteverted              Pap taken: No. Bimanual Exam:  Uterus:  normal size, contour, position, consistency, mobility, non-tender              Adnexa: no mass, fullness, tenderness               Rectovaginal: Confirms               Anus:  normal sphincter tone, no lesions  A:  Well Woman with normal exam  Postmenopausal on HRT - bio-identical  S/P hysteroscopic removal of endo polyp 10/14  Atrophic vaginitis  Vit d deficiency      P:   Reviewed health and wellness pertinent to exam  Pap smear not taken today  Discussion again about benefits and risk of HRT and risk of DVT, CVA, cancer, etc.  She is willing to try again to taper down off HRT.  I called Donzetta Starch at [facility] and she will alternate current RX of Estradiol 0.5 mg and Progesterone 75 mg ( #30/ ) with a 1/4 less dose at Estradiol 0.375 mg and progesterone 56 mg tabs (#30/0).  She will alternate these every other day and then hopefully feel well to stay on the lower dose.  She also has some questions about getting a thermogram instead of a mammogram.  I advised her that the 3 D is the most accurate for her to get given the fact she is on HRT.  She is in agreement and will schedule.  Counseled on breast self exam, mammography screening, use and side effects of HRT, osteoporosis, adequate intake of calcium and vitamin D, diet and exercise, Kegel's exercises return annually or prn  An After Visit Summary was printed and given to the patient.

## 2014-03-01 NOTE — Progress Notes (Signed)
Reviewed personally.  M. Suzanne Hildagarde Holleran, MD.  

## 2014-03-02 ENCOUNTER — Telehealth: Payer: Self-pay

## 2014-03-02 MED ORDER — ESTROGENS, CONJUGATED 0.625 MG/GM VA CREA: TOPICAL_CREAM | Freq: Every day | VAGINAL | Status: DC

## 2014-03-02 NOTE — Telephone Encounter (Signed)
Fax from [facility] states that the rx is commercially available and asks the office to send to pt retail pharmacy.  Rx sent  Encounter closed

## 2014-03-03 ENCOUNTER — Other Ambulatory Visit: Payer: Self-pay

## 2014-03-03 DIAGNOSIS — Z1231 Encounter for screening mammogram for malignant neoplasm of breast: Secondary | ICD-10-CM

## 2014-03-07 DIAGNOSIS — L259 Unspecified contact dermatitis, unspecified cause: Secondary | ICD-10-CM | POA: Diagnosis not present

## 2014-03-07 DIAGNOSIS — Z85828 Personal history of other malignant neoplasm of skin: Secondary | ICD-10-CM | POA: Diagnosis not present

## 2014-03-13 DIAGNOSIS — G44219 Episodic tension-type headache, not intractable: Secondary | ICD-10-CM | POA: Diagnosis not present

## 2014-03-13 DIAGNOSIS — G43009 Migraine without aura, not intractable, without status migrainosus: Secondary | ICD-10-CM | POA: Diagnosis not present

## 2014-03-17 ENCOUNTER — Ambulatory Visit: Payer: Medicare Other

## 2014-03-28 ENCOUNTER — Ambulatory Visit
Admission: RE | Admit: 2014-03-28 | Discharge: 2014-03-28 | Disposition: A | Discharge status: Home / Self Care | Payer: Medicare Other | Source: Ambulatory Visit

## 2014-03-28 DIAGNOSIS — Z1231 Encounter for screening mammogram for malignant neoplasm of breast: Secondary | ICD-10-CM

## 2014-04-28 ENCOUNTER — Other Ambulatory Visit: Payer: Self-pay

## 2014-05-01 ENCOUNTER — Other Ambulatory Visit: Payer: Self-pay | Admitting: *Deleted

## 2014-05-01 NOTE — Telephone Encounter (Signed)
Incoming fax from [facility] requesting compound Rx  Last AEX 02/28/14 Last refill 08/08/13 #90/2R Next appt 03/05/15  Please advise.

## 2014-05-02 MED ORDER — NONFORMULARY OR COMPOUNDED ITEM: Status: DC

## 2014-05-08 ENCOUNTER — Telehealth: Payer: Self-pay | Admitting: Nurse Practitioner

## 2014-05-08 NOTE — Telephone Encounter (Signed)
Patient is asking for a refill of estradiol and progestrone. [facility]. Patient needs ASAP, going out of town  Wed. 05/10/14.

## 2014-05-08 NOTE — Telephone Encounter (Signed)
Last refill 05/02/14 #90/2R

## 2014-05-09 ENCOUNTER — Other Ambulatory Visit: Payer: Self-pay | Admitting: Nurse Practitioner

## 2014-05-09 NOTE — Telephone Encounter (Signed)
Patient needed lower dose given by Ms patty on 02/28/14. Estradiol 0.375mg  and progesterone 56 mg called in.  Pt notified.

## 2014-05-15 ENCOUNTER — Encounter: Payer: Self-pay | Admitting: Nurse Practitioner

## 2014-05-31 ENCOUNTER — Telehealth: Payer: Self-pay | Admitting: Nurse Practitioner

## 2014-05-31 NOTE — Telephone Encounter (Signed)
"  She is willing to try again to taper down off HRT. I called Donzetta Starch at [facility] and she will alternate current RX of Estradiol 0.5 mg and Progesterone 75 mg ( #30/ ) with a 1/4 less dose at Estradiol 0.375 mg and progesterone 56 mg tabs (#30/0). She will alternate these every other day and then hopefully feel well to stay on the lower dose." - Per Ms Chong Sicilian 02/28/14  Patient using both Rx alternating every other day.  [facility]ed [facility]  Estradiol/ Progesterone 0.5/75 mg has 2 refills left.  Estradiol/Progesterone 0.375/56 mg. Called in #90 days/ 2 refills to last her until 02/2015.   LM for pt.

## 2014-05-31 NOTE — Telephone Encounter (Signed)
Patient calling stating the pharmacy does not have her refills on file. See previous documentation below.  Elroy Channel, CMA at 05/09/2014 9:23 AM     Status: Signed       Expand All Collapse All   Patient needed lower dose given by Ms patty on 02/28/14. Estradiol 0.375mg  and progesterone 56 mg called in.  Pt notified.             Elroy Channel, CMA at 05/08/2014 2:12 PM     Status: Signed       Expand All Collapse All   Last refill 05/02/14 #90/2R            Zeb Comfort at 05/08/2014 1:50 PM     Status: Signed       Expand All Collapse All   Patient is asking for a refill of estradiol and progestrone. [facility]. Patient needs ASAP, going out of town Wed. 05/10/14.

## 2014-07-03 ENCOUNTER — Encounter: Payer: Self-pay | Admitting: Sports Medicine

## 2014-07-03 ENCOUNTER — Ambulatory Visit (INDEPENDENT_AMBULATORY_CARE_PROVIDER_SITE_OTHER): Payer: Medicare Other | Admitting: Sports Medicine

## 2014-07-03 VITALS — BP 127/74 | HR 56 | Ht 61.0 in | Wt 113.0 lb

## 2014-07-03 DIAGNOSIS — S76311A Strain of muscle, fascia and tendon of the posterior muscle group at thigh level, right thigh, initial encounter: Secondary | ICD-10-CM | POA: Diagnosis not present

## 2014-07-03 NOTE — Progress Notes (Signed)
Subjective:    Patient ID: Cindy Valencia, female    DOB: May 22, 1941, 73 y.o.   MRN: [id number]  HPI  Right posterior thigh pain: Patient presents to sports medicine clinic for initial evaluation of right leg pain. Patient states her posterior thigh has hurt for a few months. She feels the original injury was from over stretching and yoga. She sees a massage therapist, who recommended she come to sports medicine. She feels as if she pulled her hamstring. She states that she gets pain from buttocks to midcalf, on the right. She also states she suffers from right hip bursitis. She reports the pain is worse with walking, yoga and steps. She tried ice, but states that did not help. She has no history of back injury, gout or arthritis. She is on hormone replacement. Otherwise no chronic ills or regular meds.  Former Smoker  Allergies  Allergen Reactions  . Contrast Media [Iodinated Diagnostic Agents] Anaphylaxis    And hives and throat closed  . Codeine     REACTION: nausea  . Epinephrine Palpitations    Increased heart rate     Medication List       This list is accurate as of: 07/03/14  9:04 AM.  Always use your most recent med list.               Biotin 5000 MCG Caps  Take 5,000 mcg by mouth every evening.     conjugated estrogens vaginal cream  Commonly known as:  PREMARIN  Place vaginally daily. Use 1/2 g vaginally twice weekly     EVENING PRIMROSE OIL PO  Take 1,300 mg by mouth 2 (two) times daily.     fish oil-omega-3 fatty acids 1000 MG capsule  Take 1 g by mouth daily.     fluticasone 50 MCG/ACT nasal spray  Commonly known as:  FLONASE  Place 2 sprays into the nose daily as needed for allergies.     GROUND FLAX SEEDS PO  Take 1 capsule by mouth every morning.     MAGNESIUM CITRATE PO  Take 150 mg by mouth every evening.     multivitamin with minerals Tabs tablet  Take 1 tablet by mouth daily.     NONFORMULARY OR COMPOUNDED ITEM  Estradiol 0.5mg  and  Progesterone 75 mg capsule.  One daily.     OVER THE COUNTER MEDICATION  Take 1 tablet by mouth daily. 500 mg Reservatrol     OVER THE COUNTER MEDICATION  Take 1-2 tablets by mouth 2 (two) times daily. Dark Cherry (vitamin) 1 in AM and 2 in PM     VITAMIN D (CHOLECALCIFEROL) PO  Take 2,000 mg by mouth daily.       Past Medical History  Diagnosis Date  . Migraine headache   . Colon polyp 6/09  . IBS (irritable bowel syndrome)   . Skin cancer   . Lipoma 2000    right upper chest wall  . Skin cancer   . Endometrial polyp 12/07   Review of Systems Per HPI    Objective:   Physical Exam BP 127/74 mmHg  Pulse 56  Ht 5\' 1"  (1.549 m)  Wt 113 lb (51.256 kg)  BMI 21.36 kg/m2  LMP 03/01/2013 Gen: Pleasant, thin, Caucasian female, no acute distress, nontoxic in appearance, well-developed, well-nourished, physically fit, alert, oriented 3 MSK: Nontender to palpation, no piriformis sensitivity. Mild right hip trochanteric bursitis tenderness. Full range of motion bilateral. 5/5 muscle strength plantar flexion/dorsi flexion of  feet, flexion/extension of hip, abduction/abduction of hip. 90 leg flexion against resistance 5/5. 20 knee flexion resistance with mild weakness medial position. Negative straight leg raise bilateral, Positive Fabre right. Neurovascularly intact distally.    Assessment & Plan:  Cindy Valencia is a 73 y.o. female presents with right semimembranosus strain with right sciatica. - Compression sleeve with activity - Exercises given and explained today (Diver: Four sets 6, extender: 4 sets of 8, calf raises: 4 sets of 6, [place]: 4 sets of 6 as tolerated) - Caution with yoga, and of her right leg. - Follow-up in 6 weeks  If not improving try [place] and consider amitriptyline

## 2014-07-03 NOTE — Assessment & Plan Note (Signed)
See progress note  Try HEP No meds Modify exercise    Reck 6 wks unless resolved

## 2014-07-24 DIAGNOSIS — L570 Actinic keratosis: Secondary | ICD-10-CM | POA: Diagnosis not present

## 2014-07-24 DIAGNOSIS — L820 Inflamed seborrheic keratosis: Secondary | ICD-10-CM | POA: Diagnosis not present

## 2014-07-24 DIAGNOSIS — Z85828 Personal history of other malignant neoplasm of skin: Secondary | ICD-10-CM | POA: Diagnosis not present

## 2014-07-24 DIAGNOSIS — L821 Other seborrheic keratosis: Secondary | ICD-10-CM | POA: Diagnosis not present

## 2014-07-24 DIAGNOSIS — D225 Melanocytic nevi of trunk: Secondary | ICD-10-CM | POA: Diagnosis not present

## 2014-07-24 DIAGNOSIS — D2262 Melanocytic nevi of left upper limb, including shoulder: Secondary | ICD-10-CM | POA: Diagnosis not present

## 2014-07-24 DIAGNOSIS — L814 Other melanin hyperpigmentation: Secondary | ICD-10-CM | POA: Diagnosis not present

## 2014-07-24 DIAGNOSIS — D1801 Hemangioma of skin and subcutaneous tissue: Secondary | ICD-10-CM | POA: Diagnosis not present

## 2014-07-25 DIAGNOSIS — Z008 Encounter for other general examination: Secondary | ICD-10-CM | POA: Diagnosis not present

## 2014-07-25 DIAGNOSIS — E785 Hyperlipidemia, unspecified: Secondary | ICD-10-CM | POA: Diagnosis not present

## 2014-08-01 DIAGNOSIS — H9190 Unspecified hearing loss, unspecified ear: Secondary | ICD-10-CM | POA: Diagnosis not present

## 2014-08-01 DIAGNOSIS — M545 Low back pain: Secondary | ICD-10-CM | POA: Diagnosis not present

## 2014-08-01 DIAGNOSIS — J449 Chronic obstructive pulmonary disease, unspecified: Secondary | ICD-10-CM | POA: Diagnosis not present

## 2014-08-01 DIAGNOSIS — Z6821 Body mass index (BMI) 21.0-21.9, adult: Secondary | ICD-10-CM | POA: Diagnosis not present

## 2014-08-01 DIAGNOSIS — Z1389 Encounter for screening for other disorder: Secondary | ICD-10-CM | POA: Diagnosis not present

## 2014-08-01 DIAGNOSIS — R51 Headache: Secondary | ICD-10-CM | POA: Diagnosis not present

## 2014-08-01 DIAGNOSIS — Z780 Asymptomatic menopausal state: Secondary | ICD-10-CM | POA: Diagnosis not present

## 2014-08-01 DIAGNOSIS — E785 Hyperlipidemia, unspecified: Secondary | ICD-10-CM | POA: Diagnosis not present

## 2014-08-01 DIAGNOSIS — Z0000 Encounter for general adult medical examination without abnormal findings: Secondary | ICD-10-CM | POA: Diagnosis not present

## 2014-08-01 DIAGNOSIS — M549 Dorsalgia, unspecified: Secondary | ICD-10-CM | POA: Diagnosis not present

## 2014-08-01 DIAGNOSIS — J309 Allergic rhinitis, unspecified: Secondary | ICD-10-CM | POA: Diagnosis not present

## 2014-08-16 ENCOUNTER — Ambulatory Visit: Payer: Medicare Other | Admitting: Sports Medicine

## 2014-08-17 DIAGNOSIS — Z1212 Encounter for screening for malignant neoplasm of rectum: Secondary | ICD-10-CM | POA: Diagnosis not present

## 2014-08-22 ENCOUNTER — Telehealth: Payer: Self-pay | Admitting: *Deleted

## 2014-08-22 ENCOUNTER — Other Ambulatory Visit: Payer: Self-pay | Admitting: Nurse Practitioner

## 2014-08-22 NOTE — Telephone Encounter (Signed)
Medication refill request:  Estradiol .375 mg /Progesterone 56 mg Last AEX:   02/18/14 with Ms. Patty Next AEX: 03/05/15 with Ms. Patty Last MMG (if hormonal medication request): 03/29/14 Breast Density Category C: Bi-Rads 1: Negative  Refill authorized: #90/2 refills   According to AEX note from 02/28/14 with Ms. Patty patient was to taper off HRT.  (See part of AEX note below)   She is willing to try again to taper down off HRT. I called Donzetta Starch at [facility] and she will alternate current RX of Estradiol 0.5 mg and Progesterone 75 mg ( #30/ ) with a 1/4 less dose at Estradiol 0.375 mg and progesterone 56 mg tabs (#30/0). She will alternate these every other day and then hopefully feel well to stay on the lower dose.  Called and s/w patient she is on Estradiol .375 mg and Progesterone 56 mg and is needing refills. Patient is going out of town a week from today told her I would send this to Ms. Patty and give her a call back please advise if new refills can be sent in.

## 2014-08-22 NOTE — Telephone Encounter (Signed)
Pt states she is having trouble getting her medication for hormone replacement refilled. She is upset about why it is so difficult to get her refill this time.  [facility] and states [facility] will send a request

## 2014-08-22 NOTE — Telephone Encounter (Signed)
Yes she can have a refill on the current dosing from [place] until AEX.

## 2014-08-23 ENCOUNTER — Ambulatory Visit (INDEPENDENT_AMBULATORY_CARE_PROVIDER_SITE_OTHER): Payer: Medicare Other | Admitting: Sports Medicine

## 2014-08-23 ENCOUNTER — Other Ambulatory Visit: Payer: Self-pay | Admitting: *Deleted

## 2014-08-23 ENCOUNTER — Encounter: Payer: Self-pay | Admitting: Sports Medicine

## 2014-08-23 VITALS — BP 121/77 | HR 61 | Ht 61.0 in | Wt 113.0 lb

## 2014-08-23 DIAGNOSIS — M7061 Trochanteric bursitis, right hip: Secondary | ICD-10-CM | POA: Diagnosis not present

## 2014-08-23 DIAGNOSIS — G5701 Lesion of sciatic nerve, right lower limb: Secondary | ICD-10-CM | POA: Diagnosis not present

## 2014-08-23 MED ORDER — AMITRIPTYLINE HCL 10 MG PO TABS
10.0000 mg | ORAL_TABLET | Freq: Every day | ORAL | Status: DC
Start: 2014-08-23 — End: 2014-08-23

## 2014-08-23 MED ORDER — NONFORMULARY OR COMPOUNDED ITEM
Status: DC
Start: 2014-08-23 — End: 2015-03-05

## 2014-08-23 MED ORDER — AMITRIPTYLINE HCL 10 MG PO TABS: 10.0000 mg | ORAL_TABLET | Freq: Every day | ORAL | Status: DC

## 2014-08-23 NOTE — Telephone Encounter (Signed)
Encounter opened in error.  Closed.

## 2014-08-23 NOTE — Assessment & Plan Note (Signed)
Piriformis tendinosis seen on ultrasound today. Also believes this may be aggravating the sciatic nerve. -Continue gentle activity, wearing compression sleeve. -Start amitryiptyline 10mg  once nightly for 1 week, then can increase to 2 tablets (20mg ) once at night for nerve irritation. -Hip rotation and gentle range of motion exercises, eccentric hamstring exercises -follow-up in 6-8 weeks if needed.

## 2014-08-23 NOTE — Progress Notes (Signed)
   Subjective:    Patient ID: Cindy Valencia, female    DOB: [birth year], 74 y.o.   MRN: [id number]  HPI Cindy Valencia Is a 74 year old female who presents for followup of right posterior thigh and right gluteal pain. She has been dealing with this for several months. She tried doing a home exercise program, but says that the diver exercises made her symptoms worse. She has been doing hamstring curls mainly. Location of pain is mainly in the right gluteal region. She tried tennis ball rolling. She is not taking any medications at this time for this. She isn't wearing a body helix compression sleeve with little relief. Her pain occasionally wakes her up at night. Character of a pain is a deep dull throbbing. She is leaving next week on a trip to [place], [place] and is hopeful to be able to do some light hiking. She denies any weakness, numbness, or tingling.  Past medical history, social history, medications, and allergies were reviewed and are up to date in the chart.  Review of Systems 7 point review of systems was performed and was otherwise negative unless noted in the history of present illness.     Objective:   Physical Exam BP 121/77 mmHg  Pulse 61  Ht 5\' 1"  (1.549 m)  Wt 113 lb (51.256 kg)  BMI 21.36 kg/m2  LMP 03/01/2013 GEN: The patient is well-developed well-nourished female and in no acute distress.  She is awake alert and oriented x3. SKIN: warm and well-perfused, no rash  EXTR: No lower extremity edema or calf tenderness Neuro: Strength 5/5 globally. Sensation intact throughout. No focal deficits. Vasc: +2 bilateral distal pulses. No edema.  MSK: Examination of the right lower extremity reveals full internal and external range of motion of the hip without pain. Positive Corky Sox. Positive Fadir with tenderness elicited over the gluteal region. Tenderness to palpation at the piriformis. Tender over the greater trochanteric bursal region. Minimal to no pain with palpation of the hamstring  nor its insertion. No swelling or bruising. Grossly full range of motion of the lumbar spine with no bony tenderness.  Limited musculoskeletal ultrasound: Long and short axis views were obtained of the right hamstring and greater trochanteric/gluteal region. There is bursal swelling of the greater trochanter region. Long view of the piriformis reveals a slight hypoechoic change located just proximally to its distal insertion. The hamstring tendon and its insertion appear intact and without any significant degenerative changes.    Assessment & Plan:  Please see problem based assessment and plan in the problem list.

## 2014-08-23 NOTE — Assessment & Plan Note (Signed)
Bursal swelling seen on ultrasound today. -Discussed possibility of a cortisone injection to the greater trochanteric region, but patient wishes to avoid at this time. -Continue gentle activity, wearing compression sleeve. -Start amitryiptyline 10mg  once nightly for 1 week, then can increase to 2 tablets (20mg ) once at night for nerve irritation. -Gentle hip range of motion exercises focusing on piriformis and gentle hamstring exercises -f/u in 6-8 weeks if needed.

## 2014-08-23 NOTE — Telephone Encounter (Signed)
Estradiol 0.375/Progesterone 56 mg #90/2 rfs rx printed and signed by Ms. Patty and faxed to [facility].  LM on patient's vm that rx has been faxed to pharmacy.  Routed to provider for review, encounter closed.

## 2014-08-23 NOTE — Telephone Encounter (Signed)
Olga Millers, CMA at 08/23/2014 9:04 AM     Status: Signed       Expand All Collapse All   Estradiol 0.375/Progesterone 56 mg #90/2 rfs rx printed and signed by Ms. Patty and faxed to [facility].  LM on patient's vm that rx has been faxed to pharmacy.  Routed to provider for review, encounter closed.             Milford Cage, FNP at 08/22/2014 7:08 PM     Status: Signed       Expand All Collapse All   Yes she can have a refill on the current dosing from [place] until AEX.            Olga Millers, CMA at 08/22/2014 11:03 AM     Status: Signed       Expand All Collapse All   Medication refill request: Estradiol .375 mg /Progesterone 56 mg Last AEX: 02/18/14 with Ms. Patty Next AEX: 03/05/15 with Ms. Patty Last MMG (if hormonal medication request): 03/29/14 Breast Density Category C: Bi-Rads 1: Negative  Refill authorized: #90/2 refills   According to AEX note from 02/28/14 with Ms. Patty patient was to taper off HRT.  (See part of AEX note below)   She is willing to try again to taper down off HRT. I called Donzetta Starch at [facility] and she will alternate current RX of Estradiol 0.5 mg and Progesterone 75 mg ( #30/ ) with a 1/4 less dose at Estradiol 0.375 mg and progesterone 56 mg tabs (#30/0). She will alternate these every other day and then hopefully feel well to stay on the lower dose.  Called and s/w patient she is on Estradiol .375 mg and Progesterone 56 mg and is needing refills. Patient is going out of town a week from today told her I would send this to Ms. Patty and give her a call back please advise if new refills can be sent in.

## 2014-08-23 NOTE — Patient Instructions (Signed)
Your symptoms seem to be due to right greater trochanteric bursitis and piriformis muscle strain. -Continue gentle activity, wearing compression sleeve. -Start amitryiptyline 10mg  once nightly for 1 week, then can increase to 2 tablets (20mg ) once at night for nerve irritation. -Some exercises to focus on include: 1. Gentle right leg circular exercises, clockwise and counterclockwise. 3 times in each direction. 2. Balancing by holding onto a chair or leaning against a wall to your left, Flex right hip to 90 degrees, Rotate hip out to 90, Rotate back in to midline, step down. Repeat 8 times x 3 repeats. 3. Laying on your back, bring right leg up, straighten knee out until you feel gentle hamstring resistance, then bend knee back down. Repeat 8 x 3 repeats. -We can see you back in 6-8 weeks if needed.

## 2014-10-04 ENCOUNTER — Ambulatory Visit (INDEPENDENT_AMBULATORY_CARE_PROVIDER_SITE_OTHER): Payer: Medicare Other | Admitting: Sports Medicine

## 2014-10-04 ENCOUNTER — Encounter: Payer: Self-pay | Admitting: Sports Medicine

## 2014-10-04 VITALS — BP 109/57 | HR 67 | Ht 61.0 in | Wt 112.0 lb

## 2014-10-04 DIAGNOSIS — M7061 Trochanteric bursitis, right hip: Secondary | ICD-10-CM

## 2014-10-04 DIAGNOSIS — G5701 Lesion of sciatic nerve, right lower limb: Secondary | ICD-10-CM | POA: Diagnosis not present

## 2014-10-04 MED ORDER — AMITRIPTYLINE HCL 10 MG PO TABS: 10.0000 mg | ORAL_TABLET | Freq: Every day | ORAL | Status: DC

## 2014-10-04 NOTE — Assessment & Plan Note (Signed)
Piriformis tendinosis much improved based on history and exam today. -Continue amitryiptyline 10mg  -20 mg once nightly PRN for nerve irritation. -Hip rotation and gentle range of motion exercises, has already returned to yoga -follow-up as needed

## 2014-10-04 NOTE — Assessment & Plan Note (Signed)
Much improved based on exam and history today. -Continue amitryiptyline 10mg  -20 mg once nightly PRN nerve irritation. -Gentle hip range of motion exercises and return to activity -f/u PRN

## 2014-10-04 NOTE — Progress Notes (Signed)
   Subjective:    Patient ID: Cindy Valencia, female    DOB: 1940/07/29, 74 y.o.   MRN: [id number]  HPI Ms. Cajas Is a 74 year old female who presents for followup of right posterior thigh and right gluteal pain 2/2 trochanteric bursitis and piriformis syndrome.  Her pain is 95% improved from last visit.  She has been taking 20 mg amitriptyline at night which she thinks has helped the most.  She has also been doing her given exercises (50% of the days).  She did have very mild buttock pain last night when sleeping but it resolved on its own.  She is back to doing yoga and biking.   Past medical history, social history, medications, and allergies were reviewed and are up to date in the chart.  Review of Systems 7 point review of systems was performed and was otherwise negative unless noted in the history of present illness.     Objective:   Physical Exam BP 109/57 mmHg  Pulse 67  Ht 5\' 1"  (1.549 m)  Wt 112 lb (50.803 kg)  BMI 21.17 kg/m2  LMP 03/01/2013 GEN: The patient is well-developed well-nourished female and in no acute distress.  She is awake alert and oriented x3. SKIN: warm and well-perfused, no rash  EXTR: No lower extremity edema or calf tenderness Neuro: Strength 5/5 globally. Sensation intact throughout. No focal deficits. Vasc: +2 bilateral distal pulses. No edema.  MSK: Examination of the right lower extremity reveals full internal and external range of motion of the hip without pain. Limited Corky Sox but no pain. Negative Fadir. No tenderness to palpation at the piriformis. Mildly tender over the greater trochanteric bursal region.. No swelling or bruising. Grossly full range of motion of the lumbar spine with no bony tenderness.    Assessment & Plan:  Please see problem based assessment and plan in the problem list.

## 2014-10-05 NOTE — Addendum Note (Signed)
Addended by: Cyd Silence on: 10/05/2014 08:56 AM   Modules accepted: Medications

## 2014-10-31 DIAGNOSIS — H2513 Age-related nuclear cataract, bilateral: Secondary | ICD-10-CM | POA: Diagnosis not present

## 2014-10-31 DIAGNOSIS — D539 Nutritional anemia, unspecified: Secondary | ICD-10-CM | POA: Diagnosis not present

## 2014-10-31 DIAGNOSIS — H524 Presbyopia: Secondary | ICD-10-CM | POA: Diagnosis not present

## 2014-10-31 DIAGNOSIS — H35372 Puckering of macula, left eye: Secondary | ICD-10-CM | POA: Diagnosis not present

## 2014-10-31 DIAGNOSIS — E785 Hyperlipidemia, unspecified: Secondary | ICD-10-CM | POA: Diagnosis not present

## 2014-11-16 DIAGNOSIS — J209 Acute bronchitis, unspecified: Secondary | ICD-10-CM | POA: Diagnosis not present

## 2014-11-16 DIAGNOSIS — Z6821 Body mass index (BMI) 21.0-21.9, adult: Secondary | ICD-10-CM | POA: Diagnosis not present

## 2014-11-16 DIAGNOSIS — J449 Chronic obstructive pulmonary disease, unspecified: Secondary | ICD-10-CM | POA: Diagnosis not present

## 2014-11-16 DIAGNOSIS — R05 Cough: Secondary | ICD-10-CM | POA: Diagnosis not present

## 2014-11-21 DIAGNOSIS — G43009 Migraine without aura, not intractable, without status migrainosus: Secondary | ICD-10-CM | POA: Diagnosis not present

## 2014-11-21 DIAGNOSIS — G44219 Episodic tension-type headache, not intractable: Secondary | ICD-10-CM | POA: Diagnosis not present

## 2015-01-08 ENCOUNTER — Other Ambulatory Visit: Payer: Self-pay

## 2015-03-05 ENCOUNTER — Ambulatory Visit (INDEPENDENT_AMBULATORY_CARE_PROVIDER_SITE_OTHER): Payer: Medicare Other | Admitting: Nurse Practitioner

## 2015-03-05 ENCOUNTER — Encounter: Payer: Self-pay | Admitting: Nurse Practitioner

## 2015-03-05 VITALS — BP 112/74 | HR 64 | Ht 61.0 in | Wt 114.0 lb

## 2015-03-05 DIAGNOSIS — E559 Vitamin D deficiency, unspecified: Secondary | ICD-10-CM | POA: Diagnosis not present

## 2015-03-05 DIAGNOSIS — E2839 Other primary ovarian failure: Secondary | ICD-10-CM

## 2015-03-05 DIAGNOSIS — Z01419 Encounter for gynecological examination (general) (routine) without abnormal findings: Secondary | ICD-10-CM

## 2015-03-05 DIAGNOSIS — Z79890 Hormone replacement therapy (postmenopausal): Secondary | ICD-10-CM

## 2015-03-05 DIAGNOSIS — N952 Postmenopausal atrophic vaginitis: Secondary | ICD-10-CM

## 2015-03-05 MED ORDER — NONFORMULARY OR COMPOUNDED ITEM: Status: DC

## 2015-03-05 MED ORDER — ESTROGENS, CONJUGATED 0.625 MG/GM VA CREA: TOPICAL_CREAM | Freq: Every day | VAGINAL | Status: DC

## 2015-03-05 NOTE — Patient Instructions (Addendum)

## 2015-03-05 NOTE — Progress Notes (Signed)
Patient ID: Cindy Valencia, female   DOB: 1940/11/30, 74 y.o.   MRN: [id number]   74 y.o. G2P2 Married  Caucasian Fe here for annual exam.  Will be seeing Integrative therapy for hamstring pain on the right.  She also has scoliosis and thinks this may be adding to her right hip pain.  Wants to continue on HRT.  She feels well otherwise.  Patient's last menstrual period was 07/14/1992.          Sexually active: Yes.    The current method of family planning is post menopausal status.    Exercising: Yes.    walking, weights, and stationary bike Smoker:  no  Health Maintenance: Pap: 02/24/2013, negative with neg HR HPV MMG:  03/28/14, Bi-Rads 1:  Negative Colonoscopy: 12/14/2007, benign colonic mucosa, repeat in 10 years, IFOB is given by PCP BMD: 07/2009 T score: spine 0.5/left neck -1.3 TDaP: 2010 Labs: PCP Dr. Joylene Draft maintains all labs and urine   reports that she has quit smoking. She has never used smokeless tobacco. She reports that she does not drink alcohol or use illicit drugs.  Past Medical History  Diagnosis Date  . Migraine headache   . Colon polyp 6/09  . IBS (irritable bowel syndrome)   . Skin cancer   . Lipoma 2000    right upper chest wall  . Skin cancer   . Endometrial polyp 12/07    Past Surgical History  Procedure Laterality Date  . Tonsillectomy and adenoidectomy    . Mole removal    . Vaginal delivery      x2   . Dilatation & currettage/hysteroscopy with resectocope N/A 05/03/2013    Procedure: [place];  Surgeon: Lyman Speller, MD;  Location: [facility];  Service: Gynecology;  Laterality: N/A;    Current Outpatient Prescriptions  Medication Sig Dispense Refill  . Biotin 5000 MCG CAPS Take 5,000 mcg by mouth every evening.    . conjugated estrogens (PREMARIN) vaginal cream Place vaginally daily. Use 1/2 g vaginally twice weekly 42.5 g 3  . EVENING PRIMROSE OIL PO Take 1,300 mg by mouth 2 (two) times daily.     . fish oil-omega-3 fatty acids 1000 MG capsule Take 1 g by mouth daily.    . Flaxseed, Linseed, (GROUND FLAX SEEDS PO) Take 1 capsule by mouth every morning.    Marland Kitchen MAGNESIUM CITRATE PO Take 150 mg by mouth every evening.     . Multiple Vitamin (MULTIVITAMIN WITH MINERALS) TABS tablet Take 1 tablet by mouth daily.    . NONFORMULARY OR COMPOUNDED ITEM Estradiol 0.375mg  and Progesterone 56 mg capsule.  One daily. 90 each 2  . OVER THE COUNTER MEDICATION Take 1 tablet by mouth daily. 500 mg Reservatrol    . OVER THE COUNTER MEDICATION Take 1-2 tablets by mouth 2 (two) times daily. Dark Cherry (vitamin) 1 in AM and 2 in PM    . Pitavastatin Calcium (LIVALO) 2 MG TABS Take 2 mg by mouth every Monday, Wednesday, and Friday.    . SUMAtriptan (IMITREX) 50 MG tablet   0  . VITAMIN D, CHOLECALCIFEROL, PO Take 2,000 mg by mouth daily.      No current facility-administered medications for this visit.    Family History  Problem Relation Age of Onset  . Lung cancer Father   . Heart disease Father   . Heart attack Mother   . Osteoarthritis Mother   . Heart disease Maternal Grandfather   . Heart  disease Maternal Grandmother   . Osteoporosis Sister     ROS:  Pertinent items are noted in HPI.  Otherwise, a comprehensive ROS was negative.  Exam:   BP 112/74 mmHg  Pulse 64  Ht 5\' 1"  (1.549 m)  Wt 114 lb (51.71 kg)  BMI 21.55 kg/m2  LMP 07/14/1992 Height: 5\' 1"  (154.9 cm) Ht Readings from Last 3 Encounters:  03/05/15 5\' 1"  (1.549 m)  10/04/14 5\' 1"  (1.549 m)  08/23/14 5\' 1"  (1.549 m)    General appearance: alert, cooperative and appears stated age Head: Normocephalic, without obvious abnormality, atraumatic Neck: no adenopathy, supple, symmetrical, trachea midline and thyroid normal to inspection and palpation Lungs: clear to auscultation bilaterally Breasts: normal appearance, no masses or tenderness lipoma right upper chest wall is unchanged Heart: regular rate and rhythm Abdomen: soft,  non-tender; no masses,  no organomegaly Extremities: extremities normal, atraumatic, no cyanosis or edema Skin: Skin color, texture, turgor normal. No rashes or lesions Lymph nodes: Cervical, supraclavicular, and axillary nodes normal. No abnormal inguinal nodes palpated Neurologic: Grossly normal   Pelvic: External genitalia:  no lesions              Urethra:  normal appearing urethra with no masses, tenderness or lesions              Bartholin's and Skene's: normal                 Vagina: normal appearing vagina with normal color and discharge, no lesions              Cervix: anteverted              Pap taken: No. Bimanual Exam:  Uterus:  normal size, contour, position, consistency, mobility, non-tender              Adnexa: no mass, fullness, tenderness               Rectovaginal: Confirms               Anus:  normal sphincter tone, no lesions  Chaperone present:  no  A:  Well Woman with normal exam  Postmenopausal on HRT - bio-identical 2003 - present S/P hysteroscopic removal of endo polyp 10/14 Atrophic vaginitis - better on vaginal estrogen  Lipoma right chest wall - unchanged Vit d deficiency - PCP is following   P:   Reviewed health and wellness pertinent to exam  Pap smear as above  Mammogram is due 03/2015  Order placed for BMD  Refill on HRT Estradiol 0.375 and progesterone 56 mg capsule daily - print copy sent with pt.   Refill on Premarin cream to use only prn.  Counseled on risk of DVT, CVA, cancer  Discussed a further taper to 0.25 mg and she did not feel comfortable with this since she still has so many vaso symptoms.  Counseled on breast self exam, mammography screening, use and side effects of HRT, adequate intake of calcium and vitamin D, diet and exercise, Kegel's exercises return annually or prn  An After Visit Summary was printed and given to the patient.

## 2015-03-08 NOTE — Progress Notes (Signed)
Encounter reviewed by Dr. Brook Amundson C. Silva.  

## 2015-03-09 ENCOUNTER — Other Ambulatory Visit: Payer: Self-pay

## 2015-03-09 DIAGNOSIS — Z1231 Encounter for screening mammogram for malignant neoplasm of breast: Secondary | ICD-10-CM

## 2015-03-27 DIAGNOSIS — H5213 Myopia, bilateral: Secondary | ICD-10-CM | POA: Diagnosis not present

## 2015-03-27 DIAGNOSIS — H35372 Puckering of macula, left eye: Secondary | ICD-10-CM | POA: Diagnosis not present

## 2015-03-27 DIAGNOSIS — H2513 Age-related nuclear cataract, bilateral: Secondary | ICD-10-CM | POA: Diagnosis not present

## 2015-05-07 ENCOUNTER — Ambulatory Visit
Admission: RE | Admit: 2015-05-07 | Discharge: 2015-05-07 | Disposition: A | Discharge status: Home / Self Care | Payer: Medicare Other | Source: Ambulatory Visit

## 2015-05-07 ENCOUNTER — Ambulatory Visit
Admission: RE | Admit: 2015-05-07 | Discharge: 2015-05-07 | Disposition: A | Discharge status: Home / Self Care | Payer: Medicare Other | Source: Ambulatory Visit | Attending: Nurse Practitioner | Admitting: Nurse Practitioner

## 2015-05-07 DIAGNOSIS — E2839 Other primary ovarian failure: Secondary | ICD-10-CM

## 2015-05-07 DIAGNOSIS — M8588 Other specified disorders of bone density and structure, other site: Secondary | ICD-10-CM | POA: Diagnosis not present

## 2015-05-07 DIAGNOSIS — Z1231 Encounter for screening mammogram for malignant neoplasm of breast: Secondary | ICD-10-CM | POA: Diagnosis not present

## 2015-05-07 DIAGNOSIS — M810 Age-related osteoporosis without current pathological fracture: Secondary | ICD-10-CM | POA: Diagnosis not present

## 2015-06-28 DIAGNOSIS — H903 Sensorineural hearing loss, bilateral: Secondary | ICD-10-CM | POA: Diagnosis not present

## 2015-08-13 DIAGNOSIS — G44219 Episodic tension-type headache, not intractable: Secondary | ICD-10-CM | POA: Diagnosis not present

## 2015-08-13 DIAGNOSIS — G43009 Migraine without aura, not intractable, without status migrainosus: Secondary | ICD-10-CM | POA: Diagnosis not present

## 2015-08-16 ENCOUNTER — Encounter: Payer: Self-pay | Admitting: Internal Medicine

## 2015-09-04 DIAGNOSIS — D538 Other specified nutritional anemias: Secondary | ICD-10-CM | POA: Diagnosis not present

## 2015-09-04 DIAGNOSIS — B078 Other viral warts: Secondary | ICD-10-CM | POA: Diagnosis not present

## 2015-09-04 DIAGNOSIS — E785 Hyperlipidemia, unspecified: Secondary | ICD-10-CM | POA: Diagnosis not present

## 2015-09-04 DIAGNOSIS — D2239 Melanocytic nevi of other parts of face: Secondary | ICD-10-CM | POA: Diagnosis not present

## 2015-09-04 DIAGNOSIS — D1801 Hemangioma of skin and subcutaneous tissue: Secondary | ICD-10-CM | POA: Diagnosis not present

## 2015-09-04 DIAGNOSIS — M859 Disorder of bone density and structure, unspecified: Secondary | ICD-10-CM | POA: Diagnosis not present

## 2015-09-04 DIAGNOSIS — Z85828 Personal history of other malignant neoplasm of skin: Secondary | ICD-10-CM | POA: Diagnosis not present

## 2015-09-04 DIAGNOSIS — L603 Nail dystrophy: Secondary | ICD-10-CM | POA: Diagnosis not present

## 2015-09-04 DIAGNOSIS — Z0000 Encounter for general adult medical examination without abnormal findings: Secondary | ICD-10-CM | POA: Diagnosis not present

## 2015-09-04 DIAGNOSIS — L570 Actinic keratosis: Secondary | ICD-10-CM | POA: Diagnosis not present

## 2015-09-04 DIAGNOSIS — L821 Other seborrheic keratosis: Secondary | ICD-10-CM | POA: Diagnosis not present

## 2015-09-04 DIAGNOSIS — D539 Nutritional anemia, unspecified: Secondary | ICD-10-CM | POA: Diagnosis not present

## 2015-09-04 DIAGNOSIS — E784 Other hyperlipidemia: Secondary | ICD-10-CM | POA: Diagnosis not present

## 2015-09-11 DIAGNOSIS — Z6821 Body mass index (BMI) 21.0-21.9, adult: Secondary | ICD-10-CM | POA: Diagnosis not present

## 2015-09-11 DIAGNOSIS — D538 Other specified nutritional anemias: Secondary | ICD-10-CM | POA: Diagnosis not present

## 2015-09-11 DIAGNOSIS — R51 Headache: Secondary | ICD-10-CM | POA: Diagnosis not present

## 2015-09-11 DIAGNOSIS — M545 Low back pain: Secondary | ICD-10-CM | POA: Diagnosis not present

## 2015-09-11 DIAGNOSIS — Z0000 Encounter for general adult medical examination without abnormal findings: Secondary | ICD-10-CM | POA: Diagnosis not present

## 2015-09-11 DIAGNOSIS — E784 Other hyperlipidemia: Secondary | ICD-10-CM | POA: Diagnosis not present

## 2015-09-11 DIAGNOSIS — M549 Dorsalgia, unspecified: Secondary | ICD-10-CM | POA: Diagnosis not present

## 2015-09-11 DIAGNOSIS — M859 Disorder of bone density and structure, unspecified: Secondary | ICD-10-CM | POA: Diagnosis not present

## 2015-09-11 DIAGNOSIS — J3089 Other allergic rhinitis: Secondary | ICD-10-CM | POA: Diagnosis not present

## 2015-09-11 DIAGNOSIS — H9193 Unspecified hearing loss, bilateral: Secondary | ICD-10-CM | POA: Diagnosis not present

## 2015-09-11 DIAGNOSIS — Z1389 Encounter for screening for other disorder: Secondary | ICD-10-CM | POA: Diagnosis not present

## 2015-09-11 DIAGNOSIS — J449 Chronic obstructive pulmonary disease, unspecified: Secondary | ICD-10-CM | POA: Diagnosis not present

## 2015-10-15 DIAGNOSIS — Z1212 Encounter for screening for malignant neoplasm of rectum: Secondary | ICD-10-CM | POA: Diagnosis not present

## 2015-10-31 ENCOUNTER — Other Ambulatory Visit: Payer: Self-pay | Admitting: *Deleted

## 2015-10-31 NOTE — Telephone Encounter (Signed)
Patient requesting refill of Estradiol 0.375 and Progesterone 56 mg capsule to [facility]  Medication refill request: Estradiol 0.375 and Progesterone 56 mg Last AEX:  03/05/2015 with PG #90/2 rfs only was sent Next AEX: 03/10/16 with PG  Last MMG (if hormonal medication request): 05/08/2015 BI-RADS 1: Negative Refill authorized: #90/1 rfs? Please advise

## 2015-11-01 NOTE — Telephone Encounter (Signed)
Will need to have Patty look at to make sure this is what she wants her to have

## 2015-11-01 NOTE — Telephone Encounter (Signed)
Patient calling checking on status of this. Ms. Cindy Valencia can you please advise since [name] is out of the office today.

## 2015-11-02 MED ORDER — NONFORMULARY OR COMPOUNDED ITEM: Status: DC

## 2015-11-02 NOTE — Telephone Encounter (Signed)
Cindy Valencia please advise.

## 2015-11-02 NOTE — Telephone Encounter (Signed)
Rx printed, signed by Ms. Patty and faxed [facility], patient is aware.

## 2015-12-05 DIAGNOSIS — E784 Other hyperlipidemia: Secondary | ICD-10-CM | POA: Diagnosis not present

## 2016-03-10 ENCOUNTER — Ambulatory Visit (INDEPENDENT_AMBULATORY_CARE_PROVIDER_SITE_OTHER): Payer: Medicare Other | Admitting: Nurse Practitioner

## 2016-03-10 ENCOUNTER — Encounter: Payer: Self-pay | Admitting: Nurse Practitioner

## 2016-03-10 VITALS — BP 114/70 | HR 66 | Resp 12 | Ht 63.88 in | Wt 113.0 lb

## 2016-03-10 DIAGNOSIS — Z124 Encounter for screening for malignant neoplasm of cervix: Secondary | ICD-10-CM | POA: Diagnosis not present

## 2016-03-10 DIAGNOSIS — E559 Vitamin D deficiency, unspecified: Secondary | ICD-10-CM

## 2016-03-10 DIAGNOSIS — Z01419 Encounter for gynecological examination (general) (routine) without abnormal findings: Secondary | ICD-10-CM | POA: Diagnosis not present

## 2016-03-10 DIAGNOSIS — Z0000 Encounter for general adult medical examination without abnormal findings: Secondary | ICD-10-CM

## 2016-03-10 DIAGNOSIS — Z79890 Hormone replacement therapy (postmenopausal): Secondary | ICD-10-CM

## 2016-03-10 MED ORDER — ESTROGENS, CONJUGATED 0.625 MG/GM VA CREA: TOPICAL_CREAM | Freq: Every day | VAGINAL | 3 refills | Status: DC

## 2016-03-10 MED ORDER — NONFORMULARY OR COMPOUNDED ITEM: 4 refills | Status: DC

## 2016-03-10 NOTE — Progress Notes (Signed)
75 y.o. G109P0002 Married  Caucasian Fe here for annual exam.  History of elevated LDL and now on Zetia. No new health problems.  She wants to continue HRT - doing OK with the lower dose. Husband who works with [employer] [employer] is involved in a 5 yr program development.  Patient's last menstrual period was 07/14/1992.          Sexually active: Yes.    The current method of family planning is post menopausal status.    Exercising: Yes.    Cardio, weight, yoga Smoker:  no  Health Maintenance: Pap:  02/24/13 WNL neg HR HPV MMG:  05/07/15 BIRADS1/Density C Colonoscopy:  12/14/07 Polyps. Repeat 5 years BMD:   05/07/15-L Femur Neck T-score -1.5. Osteopenia TDaP:  2010 Labs: PCP Dr. Joylene Draft maintains all labs and urine   reports that she has quit smoking. She has never used smokeless tobacco. She reports that she does not drink alcohol or use drugs.  Past Medical History:  Diagnosis Date  . Colon polyp 6/09  . Endometrial polyp 12/07  . IBS (irritable bowel syndrome)   . Lipoma 2000   right upper chest wall  . Migraine headache   . Skin cancer   . Skin cancer     Past Surgical History:  Procedure Laterality Date  . DILATATION & CURRETTAGE/HYSTEROSCOPY WITH RESECTOCOPE N/A 05/03/2013   Procedure: [place];  Surgeon: Lyman Speller, MD;  Location: [facility];  Service: Gynecology;  Laterality: N/A;  . MOLE REMOVAL    . TONSILLECTOMY AND ADENOIDECTOMY    . VAGINAL DELIVERY     x2     Current Outpatient Prescriptions  Medication Sig Dispense Refill  . aspirin EC 81 MG tablet Take 81 mg by mouth every other day.    . Azelaic Acid (FINACEA) 15 % cream     . Biotin 5000 MCG CAPS Take 5,000 mcg by mouth every evening.    . conjugated estrogens (PREMARIN) vaginal cream Place vaginally daily. Use 1/2 g vaginally twice weekly 42.5 g 3  . Ergocalciferol (VITAMIN D2) 400 units TABS Take by mouth.    . EVENING PRIMROSE OIL PO Take 1,300 mg by mouth 2 (two)  times daily.    Marland Kitchen ezetimibe (ZETIA) 10 MG tablet   10  . fish oil-omega-3 fatty acids 1000 MG capsule Take 1 g by mouth daily.    . Flaxseed, Linseed, (GROUND FLAX SEEDS PO) Take 1 capsule by mouth every morning.    Marland Kitchen MAGNESIUM CITRATE PO Take 150 mg by mouth every evening.     . Multiple Vitamin (MULTIVITAMIN WITH MINERALS) TABS tablet Take 1 tablet by mouth daily.    . NONFORMULARY OR COMPOUNDED ITEM Estradiol 0.375mg  and Progesterone 56 mg capsule.  One daily. 90 each 2  . OVER THE COUNTER MEDICATION Take 1 tablet by mouth daily. 500 mg Reservatrol    . OVER THE COUNTER MEDICATION Take 1-2 tablets by mouth 2 (two) times daily. Dark Cherry (vitamin) 1 in AM and 2 in PM    . SUMAtriptan (IMITREX) 50 MG tablet   0  . VITAMIN D, CHOLECALCIFEROL, PO Take 2,000 mg by mouth daily.      No current facility-administered medications for this visit.     Family History  Problem Relation Age of Onset  . Lung cancer Father   . Heart disease Father   . Heart attack Mother   . Osteoarthritis Mother   . Heart disease Maternal Grandfather   .  Heart disease Maternal Grandmother   . Osteoporosis Sister     ROS:  Pertinent items are noted in HPI.  Otherwise, a comprehensive ROS was negative.  Exam:   BP 114/70 (BP Location: Right Arm, Patient Position: Sitting, Cuff Size: Normal)   Pulse 66   Resp 12   Ht 5' 3.88" (1.623 m)   Wt 113 lb (51.3 kg)   LMP 07/14/1992   BMI 19.47 kg/m  Height: 5' 3.88" (162.3 cm) Ht Readings from Last 3 Encounters:  03/10/16 5' 3.88" (1.623 m)  03/05/15 5\' 1"  (1.549 m)  10/04/14 5\' 1"  (1.549 m)    General appearance: alert, cooperative and appears stated age Head: Normocephalic, without obvious abnormality, atraumatic Neck: no adenopathy, supple, symmetrical, trachea midline and thyroid normal to inspection and palpation Lungs: clear to auscultation bilaterally Breasts: normal appearance, no masses or tenderness Heart: regular rate and rhythm Abdomen: soft,  non-tender; no masses,  no organomegaly Extremities: extremities normal, atraumatic, no cyanosis or edema Skin: Skin color, texture, turgor normal. No rashes or lesions Lymph nodes: Cervical, supraclavicular, and axillary nodes normal. No abnormal inguinal nodes palpated Neurologic: Grossly normal   Pelvic: External genitalia:  no lesions              Urethra:  normal appearing urethra with no masses, tenderness or lesions              Bartholin's and Skene's: normal                 Vagina: normal appearing vagina with normal color and discharge, no lesions              Cervix: anteverted              Pap taken: Yes.   Bimanual Exam:  Uterus:  normal size, contour, position, consistency, mobility, non-tender              Adnexa: no mass, fullness, tenderness               Rectovaginal: Confirms               Anus:  normal sphincter tone, no lesions  Chaperone present: yes  A:  Well Woman with normal exam  Postmenopausal on HRT - bio-identical 2003 - present S/P hysteroscopic removal of endo polyp 10/14 Atrophic vaginitis - better on vaginal estrogen             Lipoma right chest wall - unchanged Vit d deficiency - PCP is following   P:   Reviewed health and wellness pertinent to exam  Pap smear as above  Mammogram is due 04/2016  Refill on HRT for a year and will be faxed to [facility] with risk of DVT, CVA, cancer, etc.  Counseled on breast self exam, mammography screening, use and side effects of HRT, adequate intake of calcium and vitamin D, diet and exercise, Kegel's exercises return annually or prn  An After Visit Summary was printed and given to the patient.

## 2016-03-10 NOTE — Patient Instructions (Addendum)

## 2016-03-11 LAB — IPS PAP SMEAR ONLY

## 2016-03-13 NOTE — Progress Notes (Signed)
Reviewed personally.  M. Suzanne Alleigh Mollica, MD.  

## 2016-04-17 ENCOUNTER — Other Ambulatory Visit: Payer: Self-pay | Admitting: Nurse Practitioner

## 2016-04-17 DIAGNOSIS — Z1231 Encounter for screening mammogram for malignant neoplasm of breast: Secondary | ICD-10-CM

## 2016-04-21 DIAGNOSIS — H2513 Age-related nuclear cataract, bilateral: Secondary | ICD-10-CM | POA: Diagnosis not present

## 2016-04-21 DIAGNOSIS — H5213 Myopia, bilateral: Secondary | ICD-10-CM | POA: Diagnosis not present

## 2016-05-12 DIAGNOSIS — B078 Other viral warts: Secondary | ICD-10-CM | POA: Diagnosis not present

## 2016-05-12 DIAGNOSIS — L720 Epidermal cyst: Secondary | ICD-10-CM | POA: Diagnosis not present

## 2016-05-12 DIAGNOSIS — Z85828 Personal history of other malignant neoplasm of skin: Secondary | ICD-10-CM | POA: Diagnosis not present

## 2016-05-12 DIAGNOSIS — L821 Other seborrheic keratosis: Secondary | ICD-10-CM | POA: Diagnosis not present

## 2016-05-12 DIAGNOSIS — L570 Actinic keratosis: Secondary | ICD-10-CM | POA: Diagnosis not present

## 2016-05-13 ENCOUNTER — Ambulatory Visit
Admission: RE | Admit: 2016-05-13 | Discharge: 2016-05-13 | Disposition: A | Discharge status: Home / Self Care | Payer: Medicare Other | Source: Ambulatory Visit | Attending: Nurse Practitioner | Admitting: Nurse Practitioner

## 2016-05-13 DIAGNOSIS — Z1231 Encounter for screening mammogram for malignant neoplasm of breast: Secondary | ICD-10-CM | POA: Diagnosis not present

## 2016-05-14 DIAGNOSIS — R51 Headache: Secondary | ICD-10-CM | POA: Diagnosis not present

## 2016-05-14 DIAGNOSIS — G43009 Migraine without aura, not intractable, without status migrainosus: Secondary | ICD-10-CM | POA: Diagnosis not present

## 2016-05-14 DIAGNOSIS — J310 Chronic rhinitis: Secondary | ICD-10-CM | POA: Diagnosis not present

## 2016-09-11 DIAGNOSIS — E784 Other hyperlipidemia: Secondary | ICD-10-CM | POA: Diagnosis not present

## 2016-09-11 DIAGNOSIS — D539 Nutritional anemia, unspecified: Secondary | ICD-10-CM | POA: Diagnosis not present

## 2016-09-11 DIAGNOSIS — M859 Disorder of bone density and structure, unspecified: Secondary | ICD-10-CM | POA: Diagnosis not present

## 2016-09-15 DIAGNOSIS — D538 Other specified nutritional anemias: Secondary | ICD-10-CM | POA: Diagnosis not present

## 2016-09-15 DIAGNOSIS — J3089 Other allergic rhinitis: Secondary | ICD-10-CM | POA: Diagnosis not present

## 2016-09-15 DIAGNOSIS — Z6821 Body mass index (BMI) 21.0-21.9, adult: Secondary | ICD-10-CM | POA: Diagnosis not present

## 2016-09-15 DIAGNOSIS — H9193 Unspecified hearing loss, bilateral: Secondary | ICD-10-CM | POA: Diagnosis not present

## 2016-09-15 DIAGNOSIS — Z1389 Encounter for screening for other disorder: Secondary | ICD-10-CM | POA: Diagnosis not present

## 2016-09-15 DIAGNOSIS — R51 Headache: Secondary | ICD-10-CM | POA: Diagnosis not present

## 2016-09-15 DIAGNOSIS — Z0000 Encounter for general adult medical examination without abnormal findings: Secondary | ICD-10-CM | POA: Diagnosis not present

## 2016-09-15 DIAGNOSIS — M5489 Other dorsalgia: Secondary | ICD-10-CM | POA: Diagnosis not present

## 2016-09-15 DIAGNOSIS — M545 Low back pain: Secondary | ICD-10-CM | POA: Diagnosis not present

## 2016-09-15 DIAGNOSIS — M859 Disorder of bone density and structure, unspecified: Secondary | ICD-10-CM | POA: Diagnosis not present

## 2016-09-15 DIAGNOSIS — E784 Other hyperlipidemia: Secondary | ICD-10-CM | POA: Diagnosis not present

## 2016-09-15 DIAGNOSIS — J449 Chronic obstructive pulmonary disease, unspecified: Secondary | ICD-10-CM | POA: Diagnosis not present

## 2016-10-07 DIAGNOSIS — G44219 Episodic tension-type headache, not intractable: Secondary | ICD-10-CM | POA: Diagnosis not present

## 2016-10-07 DIAGNOSIS — G43009 Migraine without aura, not intractable, without status migrainosus: Secondary | ICD-10-CM | POA: Diagnosis not present

## 2016-10-08 DIAGNOSIS — Z1212 Encounter for screening for malignant neoplasm of rectum: Secondary | ICD-10-CM | POA: Diagnosis not present

## 2016-10-14 DIAGNOSIS — M859 Disorder of bone density and structure, unspecified: Secondary | ICD-10-CM | POA: Diagnosis not present

## 2016-11-10 DIAGNOSIS — L570 Actinic keratosis: Secondary | ICD-10-CM | POA: Diagnosis not present

## 2016-11-10 DIAGNOSIS — L821 Other seborrheic keratosis: Secondary | ICD-10-CM | POA: Diagnosis not present

## 2016-11-10 DIAGNOSIS — D225 Melanocytic nevi of trunk: Secondary | ICD-10-CM | POA: Diagnosis not present

## 2016-11-10 DIAGNOSIS — B078 Other viral warts: Secondary | ICD-10-CM | POA: Diagnosis not present

## 2016-11-10 DIAGNOSIS — Z85828 Personal history of other malignant neoplasm of skin: Secondary | ICD-10-CM | POA: Diagnosis not present

## 2016-11-10 DIAGNOSIS — D1801 Hemangioma of skin and subcutaneous tissue: Secondary | ICD-10-CM | POA: Diagnosis not present

## 2017-02-05 ENCOUNTER — Ambulatory Visit (INDEPENDENT_AMBULATORY_CARE_PROVIDER_SITE_OTHER): Payer: Medicare Other | Admitting: Sports Medicine

## 2017-02-05 VITALS — BP 108/70 | Ht 61.5 in | Wt 109.0 lb

## 2017-02-05 DIAGNOSIS — M79605 Pain in left leg: Secondary | ICD-10-CM | POA: Insufficient documentation

## 2017-02-05 NOTE — Progress Notes (Signed)
HPI  CC: Left sided leg/foot cramps Started about 3 wks ago. Worse from previous cramps. Discomfort is not constant. Cramps only at night. Starts at the bottom of the foot and moves up the leg. Had in past but never this bad. No injuries. Muscles constantly seem tight.   Feels like her footwear and feet are causing some issues.  No morning pain. No heel pain. 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    ROS: Per HPI; in addition no fever, no rash, no additional weakness, no additional numbness, no additional paresthesias, and no additional falls/injury.   Objective: BP 108/70   Ht 5' 1.5" (1.562 m)   Wt 109 lb (49.4 kg)   LMP 03/01/2013   BMI 20.26 kg/m  Gen: NAD, well groomed, a/o x3, normal affect.  CV: Well-perfused. Warm.  Resp: Non-labored.  Neuro: Sensation intact throughout. No gross coordination deficits.  Gait: Nonpathologic posture, unremarkable stride without signs of limp or balance issues. Back: Slight rightward seemingly well compensated thoracic scoliosis noted. No evidence of swelling, erythema, rash, or ecchymosis. Slight tightness/tenderness over the left SI joint. Negative SLR. Good range of motion in all directions. Hip, Left: No obvious rash, erythema, ecchymosis, or edema. ROM full in all directions (IR: 80/ ER: 80/Flex: 120/Ext: 100/Abd: 45/Add: 45); Strength 5/5 throughout. Pelvic alignment unremarkable to inspection and palpation. FABER reduced when compared to right. Standing hip rotation and gait without trendelenburg /  unsteadiness. Greater trochanter without tenderness to palpation. No tenderness over piriformis. No SI joint tenderness and normal minimal SI movement. Ankle/Foot, Left: No visible erythema, swelling, ecchymosis, or bony deformity. Slightly notable pes cavus deformity bilateral. Reduced adipose tissue/fat pad deposits noted throughout feet bilaterally. No evidence of tibiotalar deviation; Range of motion is full in all directions. Strength is 5/5 in all directions. No tenderness at the insertion/body/myotendinous junction of the Achilles tendon; No peroneal tendon tenderness or subluxation; No tenderness on posterior aspects of lateral and medial malleolus; Stable lateral and medial ligaments; No plantar calcaneal tenderness; No tenderness over the navicular prominence; No tenderness over cuboid; No pain at base of 5th MT; No tenderness at the distal metatarsals; Able to walk 4 steps.    Assessment and Plan:  Left leg pain Patient is here with complaints of left-sided leg pain and cramping. Signs and symptoms consistent with lumbar radiculopathy with associated neurogenic cramping. Contributing factors include history of scoliosis and high suspicion for lumbar spine osteoarthritis. No red flag symptoms on exam or in history. - Lumbar spine x-rays ordered today. - Patient provided at home stretching exercises (need to shoulder, knee to chest, tummy to floor) - Follow-up in one week  Next: At the follow-up visit, patient will likely be fitted for shoe orthotics. We will then go over her lumbar x-rays. If there is significant evidence consistent with OA patient may warrant nighttime gabapentin dosing to help with neurologic symptoms.   Orders Placed This Encounter  Procedures  . DG Lumbar Spine 2-3 Views    Standing Status:   Future    Standing Expiration Date:   04/08/2018    Order Specific Question:   Reason  for Exam (SYMPTOM  OR DIAGNOSIS REQUIRED)    Answer:   left leg pain    Order Specific  Question:   Preferred imaging location?    Answer:   [facility]    Order Specific Question:   Radiology Contrast Protocol - do NOT remove file path    Answer:   [email or web address]     Cindy Leatherwood, MD,MS [facility] 02/05/2017 12:15 PM  I observed and examined the patient with the resident and agree with assessment and plan.  Note reviewed and modified by me. Cindy Libel, MD

## 2017-02-05 NOTE — Assessment & Plan Note (Signed)
Patient is here with complaints of left-sided leg pain and cramping. Signs and symptoms consistent with lumbar radiculopathy with associated neurogenic cramping. Contributing factors include history of scoliosis and high suspicion for lumbar spine osteoarthritis. No red flag symptoms on exam or in history. - Lumbar spine x-rays ordered today. - Patient provided at home stretching exercises (need to shoulder, knee to chest, tummy to floor) - Follow-up in one week  Next: At the follow-up visit, patient will likely be fitted for shoe orthotics. We will then go over her lumbar x-rays. If there is significant evidence consistent with OA patient may warrant nighttime gabapentin dosing to help with neurologic symptoms.

## 2017-02-05 NOTE — Patient Instructions (Signed)
It was a pleasure seeing you today in our clinic. Today we discussed your left leg pain. Here is the treatment plan we have discussed and agreed upon together:   - We would like to have you get some x-rays of your low back. - Begin performing the 3 stretching exercises at home every day. - Follow up with [place] next Thursday

## 2017-02-06 ENCOUNTER — Ambulatory Visit
Admission: RE | Admit: 2017-02-06 | Discharge: 2017-02-06 | Disposition: A | Discharge status: Home / Self Care | Payer: Medicare Other | Source: Ambulatory Visit | Attending: Sports Medicine | Admitting: Sports Medicine

## 2017-02-06 DIAGNOSIS — M79605 Pain in left leg: Secondary | ICD-10-CM

## 2017-02-06 DIAGNOSIS — M5136 Other intervertebral disc degeneration, lumbar region: Secondary | ICD-10-CM | POA: Diagnosis not present

## 2017-02-12 ENCOUNTER — Ambulatory Visit (INDEPENDENT_AMBULATORY_CARE_PROVIDER_SITE_OTHER): Payer: Medicare Other | Admitting: Family Medicine

## 2017-02-12 DIAGNOSIS — Q667 Congenital pes cavus, unspecified foot: Secondary | ICD-10-CM | POA: Insufficient documentation

## 2017-02-12 NOTE — Progress Notes (Signed)
   HPI  CC: Follow-up shoe orthotics Patient is here for sure orthotics. She states that her symptoms have improved since her last visit. She has been very compliant with her stretching and strengthening exercises provided at the last visit. She has no new complaints at this time. She denies any recent falls, trauma, or injury. No fevers or chills. Her cramping has improved but she still has bilateral foot pain with prolonged walking.  Medications/Interventions Tried: Stretching and strengthening exercises  See HPI and/or previous note for associated ROS.  Objective: BP 130/80   Ht 5' 1.5" (1.562 m)   Wt 109 lb (49.4 kg)   LMP 03/01/2013   BMI 20.26 kg/m  Gen: NAD, well groomed, a/o x3, normal affect.  CV: Well-perfused. Warm.  Resp: Non-labored.  Neuro: Sensation intact throughout. No gross coordination deficits.  Gait: Nonpathologic posture, unremarkable stride without signs of limp or balance issues. Ankle/Foot, bilateral: No visible erythema, swelling, ecchymosis, or bony deformity. Slightly notable pes cavus deformity bilateral. Reduced adipose tissue/fat pad deposits noted throughout feet bilaterally. No evidence of tibiotalar deviation; Range of motion is full in all directions. Strength is 5/5 in all directions. No tenderness at the insertion/body/myotendinous junction of the Achilles tendon; No peroneal tendon tenderness or subluxation; No tenderness on posterior aspects of lateral and medial malleolus; Stable lateral and medial ligaments; No plantar calcaneal tenderness; No tenderness over the navicular prominence; No tenderness over cuboid; No pain at base of 5th MT; No tenderness at the distal metatarsals; Able to walk 4 steps.   Shoe orthotics: Patient was fitted for a standard, cushioned, semi-rigid orthotic. The orthotic was heated and afterward the patient stood on the orthotic blank positioned on the orthotic stand. The patient was positioned in subtalar neutral position  and 10 degrees of ankle dorsiflexion in a weight bearing stance. After completion of molding, a stable base was applied to the orthotic blank. The blank was ground to a stable position for weight bearing. Size: 6 Additional Posting and Padding: None The patient ambulated these, and they were very comfortable.   Assessment and plan:  Pes cavus Patient is here for shoe orthotics. She has mild to severe pes cavus foot forms and is significantly reduced that had in bilateral feet. Shoe orthotics made for patient today. She ambulated in the office and reported improved comfort. - Follow-up as needed.   I spent >30 minutes with this patient. Over 50% of visit was spent in counseling and coordination of care for problems with her bilateral feet.  Elberta Leatherwood, MD,MS [facility] Fellow 02/12/2017 1:40 PM

## 2017-02-12 NOTE — Assessment & Plan Note (Signed)
Patient is here for shoe orthotics. She has mild to severe pes cavus foot forms and is significantly reduced that had in bilateral feet. Shoe orthotics made for patient today. She ambulated in the office and reported improved comfort. - Follow-up as needed.

## 2017-03-13 ENCOUNTER — Ambulatory Visit: Payer: Medicare Other | Admitting: Nurse Practitioner

## 2017-03-24 ENCOUNTER — Ambulatory Visit (INDEPENDENT_AMBULATORY_CARE_PROVIDER_SITE_OTHER): Payer: Medicare Other | Admitting: Certified Nurse Midwife

## 2017-03-24 ENCOUNTER — Encounter: Payer: Self-pay | Admitting: Certified Nurse Midwife

## 2017-03-24 VITALS — BP 110/68 | HR 68 | Resp 16 | Ht 60.75 in | Wt 111.0 lb

## 2017-03-24 DIAGNOSIS — Z01419 Encounter for gynecological examination (general) (routine) without abnormal findings: Secondary | ICD-10-CM

## 2017-03-24 DIAGNOSIS — N952 Postmenopausal atrophic vaginitis: Secondary | ICD-10-CM | POA: Diagnosis not present

## 2017-03-24 DIAGNOSIS — Z79890 Hormone replacement therapy (postmenopausal): Secondary | ICD-10-CM

## 2017-03-24 DIAGNOSIS — N951 Menopausal and female climacteric states: Secondary | ICD-10-CM

## 2017-03-24 DIAGNOSIS — M8580 Other specified disorders of bone density and structure, unspecified site: Secondary | ICD-10-CM

## 2017-03-24 DIAGNOSIS — Z124 Encounter for screening for malignant neoplasm of cervix: Secondary | ICD-10-CM

## 2017-03-24 MED ORDER — ESTROGENS, CONJUGATED 0.625 MG/GM VA CREA: TOPICAL_CREAM | VAGINAL | 3 refills | Status: DC

## 2017-03-24 MED ORDER — NONFORMULARY OR COMPOUNDED ITEM: 3 refills | Status: DC

## 2017-03-24 NOTE — Progress Notes (Signed)
76 y.o. G34P0002 Married  Caucasian Fe here for annual exam. Menopausal on HRT. Denies vaginal bleeding or vaginal dryness. Uses Estrace cream vaginally 2 x weekly for urinary and vaginal health. Working well. Sees Dr. Parthenia Ames PCP for cholesterol /migraine headache management. Patient concerned about osteopenia progression if she stops HRT, and would like to continue. PCP aware of use recommends aspirin use also. Patient has some neck pain that continues and wonders if her lipoma on right upper chest wall is the problem. No change in size in past few years. PCP has followed this. No other health issues today.  Patient's last period was at age 15.          Sexually active: Yes.    The current method of family planning is post menopausal status.    Exercising: Yes.    weights & cardio Smoker:  no  Health Maintenance: Pap:  02-24-13 neg HPV HR neg, 03-10-16 neg History of Abnormal Pap: no MMG:  05-13-16 category c density birads 1:neg Self Breast exams: no Colonoscopy:  2009 polyps f/u 49yrs  Declines scheduling BMD:   2016 had this year will request records TDaP:  2013 Shingles: 2013 Pneumonia: 2016 Hep C and HIV: not done Labs: if needed   reports that she has quit smoking. She has never used smokeless tobacco. She reports that she drinks about 0.6 oz of alcohol per week . She reports that she does not use drugs.  Past Medical History:  Diagnosis Date  . Colon polyp 6/09  . Endometrial polyp 12/07  . IBS (irritable bowel syndrome)   . Lipoma 2000   right upper chest wall  . Migraine headache   . Skin cancer   . Skin cancer     Past Surgical History:  Procedure Laterality Date  . DILATATION & CURRETTAGE/HYSTEROSCOPY WITH RESECTOCOPE N/A 05/03/2013   Procedure: [place];  Surgeon: Lyman Speller, MD;  Location: [facility];  Service: Gynecology;  Laterality: N/A;  . MOLE REMOVAL    . TONSILLECTOMY AND ADENOIDECTOMY    . VAGINAL DELIVERY      x2     Current Outpatient Prescriptions  Medication Sig Dispense Refill  . aspirin EC 81 MG tablet Take 81 mg by mouth every other day.    . Azelaic Acid (FINACEA) 15 % cream     . Biotin 5000 MCG CAPS Take 5,000 mcg by mouth every evening.    . Cholecalciferol (VITAMIN D3 PO) Take by mouth.    . conjugated estrogens (PREMARIN) vaginal cream Place vaginally daily. Use 1/2 g vaginally twice weekly 42.5 g 3  . EVENING PRIMROSE OIL PO Take 1,300 mg by mouth 2 (two) times daily.    Marland Kitchen ezetimibe (ZETIA) 10 MG tablet   10  . fish oil-omega-3 fatty acids 1000 MG capsule Take 1 g by mouth daily.    . Flaxseed, Linseed, (GROUND FLAX SEEDS PO) Take 1 capsule by mouth every morning.    Marland Kitchen MAGNESIUM CITRATE PO Take 150 mg by mouth every evening.     . Multiple Vitamin (MULTIVITAMIN WITH MINERALS) TABS tablet Take 1 tablet by mouth daily.    . NONFORMULARY OR COMPOUNDED ITEM Estradiol 0.375mg  and Progesterone 56 mg capsule.  One daily. 90 each 4  . OVER THE COUNTER MEDICATION Take 1-2 tablets by mouth 2 (two) times daily. Dark Cherry (vitamin) 1 in AM and 2 in PM    . Pomegranate, Punica granatum, (POMEGRANATE PO) Take by mouth.    Marland Kitchen  SUMAtriptan (IMITREX) 50 MG tablet   0   No current facility-administered medications for this visit.     Family History  Problem Relation Age of Onset  . Lung cancer Father   . Heart disease Father   . Heart attack Mother   . Osteoarthritis Mother   . Heart disease Maternal Grandfather   . Heart disease Maternal Grandmother   . Osteoporosis Sister     ROS:  Pertinent items are noted in HPI.  Otherwise, a comprehensive ROS was negative.  Exam:   BP 110/68   Pulse 68   Resp 16   Ht 5' 0.75" (1.543 m)   Wt 111 lb (50.3 kg)   LMP 03/01/2013   BMI 21.15 kg/m  Height: 5' 0.75" (154.3 cm) Ht Readings from Last 3 Encounters:  03/24/17 5' 0.75" (1.543 m)  02/12/17 5' 1.5" (1.562 m)  02/05/17 5' 1.5" (1.562 m)    General appearance: alert, cooperative and  appears stated age Head: Normocephalic, without obvious abnormality, atraumatic Neck: no adenopathy, supple, symmetrical, trachea midline and thyroid normal to inspection and palpation Lungs: clear to auscultation bilaterally Breasts: normal appearance, no masses or tenderness, No nipple retraction or dimpling, No nipple discharge or bleeding, No axillary or supraclavicular adenopathy, Lipoma noted just above right breast area, non tender 10- 12 cm size, soft Heart: regular rate and rhythm Abdomen: soft, non-tender; no masses,  no organomegaly Extremities: extremities normal, atraumatic, no cyanosis or edema Skin: Skin color, texture, turgor normal. No rashes or lesions Lymph nodes: Cervical, supraclavicular, and axillary nodes normal. No abnormal inguinal nodes palpated Neurologic: Grossly normal   Pelvic: External genitalia:  no lesions              Urethra:  normal appearing urethra with no masses, tenderness or lesions              Bartholin's and Skene's: normal                 Vagina: normal appearing vagina with normal color and discharge, no lesions              Cervix: multiparous appearance, no cervical motion tenderness and no lesions              Pap taken: No. Bimanual Exam:  Uterus:  normal size, contour, position, consistency, mobility, non-tender              Adnexa: normal adnexa and no mass, fullness, tenderness               Rectovaginal: Confirms               Anus:  normal sphincter tone, no lesions  Chaperone present: yes  A:  Well Woman with normal exam  Menopausal on HRT for Osteopenia and overall health, no absolute contraindications for use. Desires continuance  Atrophic vaginitis with Estrace cream use with good response, desires continuance  Cholesterol management with PCP  P:   Reviewed health and wellness pertinent to exam  Discussed risk/benefits of long term estrogen and progesterone. Discussed [facility] recommendations and indications for use and  discontinuance. Questions addressed at length. Discussed recommendation of lowest dose for bone health benefit and need to discontinue if any contraindications of use such as cardiovascular changes,hypertension or heart concerns or breast cancer concerns. Patient voiced understanding and would like to continue use.  Rx Estradiol 0.25mg  and Progesterone 56 mg capsule one daily. See order  Discussed benefits/risks of Estrace cream and should not  be used more than twice weekly. Patient voiced understanding.  Rx Premarin cream see order with instructions  Pap smear: no   counseled on breast self exam, mammography screening, feminine hygiene, menopause, adequate intake of calcium and vitamin D, diet and exercise  return annually or prn  An After Visit Summary was printed and given to the patient.

## 2017-03-24 NOTE — Patient Instructions (Signed)

## 2017-03-30 ENCOUNTER — Other Ambulatory Visit: Payer: Self-pay

## 2017-03-30 DIAGNOSIS — Z79890 Hormone replacement therapy (postmenopausal): Secondary | ICD-10-CM

## 2017-03-30 DIAGNOSIS — M8580 Other specified disorders of bone density and structure, unspecified site: Secondary | ICD-10-CM

## 2017-03-30 MED ORDER — NONFORMULARY OR COMPOUNDED ITEM: 3 refills | Status: DC

## 2017-03-30 NOTE — Telephone Encounter (Signed)
Rx for Estradiol-Progesterone sent to the wrong pharmacy. Per patient, prescription needs to go to [facility]. Rx cancelled at the wrong pharmacy and sent to [facility].  Okay per Dr. Sabra Heck to print to be faxed to pharmacy. DL out of office 03/30/17.  Prescription faxed to pharmacy

## 2017-04-13 DIAGNOSIS — L821 Other seborrheic keratosis: Secondary | ICD-10-CM | POA: Diagnosis not present

## 2017-04-13 DIAGNOSIS — Z419 Encounter for procedure for purposes other than remedying health state, unspecified: Secondary | ICD-10-CM | POA: Diagnosis not present

## 2017-04-13 DIAGNOSIS — Z85828 Personal history of other malignant neoplasm of skin: Secondary | ICD-10-CM | POA: Diagnosis not present

## 2017-04-13 DIAGNOSIS — L570 Actinic keratosis: Secondary | ICD-10-CM | POA: Diagnosis not present

## 2017-04-28 DIAGNOSIS — H2513 Age-related nuclear cataract, bilateral: Secondary | ICD-10-CM | POA: Diagnosis not present

## 2017-04-28 DIAGNOSIS — H5213 Myopia, bilateral: Secondary | ICD-10-CM | POA: Diagnosis not present

## 2017-05-20 DIAGNOSIS — E785 Hyperlipidemia, unspecified: Secondary | ICD-10-CM | POA: Diagnosis not present

## 2017-05-20 DIAGNOSIS — E7849 Other hyperlipidemia: Secondary | ICD-10-CM | POA: Diagnosis not present

## 2017-05-20 DIAGNOSIS — R7301 Impaired fasting glucose: Secondary | ICD-10-CM | POA: Diagnosis not present

## 2017-06-10 ENCOUNTER — Ambulatory Visit (INDEPENDENT_AMBULATORY_CARE_PROVIDER_SITE_OTHER): Payer: Medicare Other | Admitting: Internal Medicine

## 2017-06-10 ENCOUNTER — Encounter: Payer: Self-pay | Admitting: Internal Medicine

## 2017-06-10 VITALS — BP 116/86 | HR 82 | Ht 61.5 in | Wt 113.6 lb

## 2017-06-10 DIAGNOSIS — Z87898 Personal history of other specified conditions: Secondary | ICD-10-CM

## 2017-06-10 DIAGNOSIS — R0609 Other forms of dyspnea: Secondary | ICD-10-CM

## 2017-06-10 LAB — NITRIC OXIDE: NITRIC OXIDE: 39
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
## 2017-06-10 NOTE — Patient Instructions (Signed)
ICD-10-CM   1. H/O chemical exposure Z87.898   2. Dyspnea on exertion R06.09     Possible RADS which is an asthma mimic  Plan Do full PFT next fw weeks; please call [phone] and let triage desk know to send message to me to review result so I can advise next step which might involved CXR/CT versus treatment inhaler v pulmonary stress test or some combination

## 2017-06-10 NOTE — Addendum Note (Signed)
Addended by: Lorretta Harp on: 06/10/2017 11:21 AM   Modules accepted: Orders

## 2017-07-01 ENCOUNTER — Other Ambulatory Visit: Payer: Self-pay | Admitting: Internal Medicine

## 2017-07-01 DIAGNOSIS — Z1231 Encounter for screening mammogram for malignant neoplasm of breast: Secondary | ICD-10-CM

## 2017-07-03 ENCOUNTER — Ambulatory Visit: Payer: Medicare Other

## 2017-07-09 ENCOUNTER — Ambulatory Visit (INDEPENDENT_AMBULATORY_CARE_PROVIDER_SITE_OTHER): Payer: Medicare Other | Admitting: Internal Medicine

## 2017-07-09 DIAGNOSIS — Z87898 Personal history of other specified conditions: Secondary | ICD-10-CM

## 2017-07-09 DIAGNOSIS — R0609 Other forms of dyspnea: Secondary | ICD-10-CM

## 2017-07-09 NOTE — Progress Notes (Signed)
PFT completed today 07/09/17  

## 2017-08-07 ENCOUNTER — Ambulatory Visit
Admission: RE | Admit: 2017-08-07 | Discharge: 2017-08-07 | Disposition: A | Discharge status: Home / Self Care | Payer: Medicare Other | Source: Ambulatory Visit | Attending: Internal Medicine | Admitting: Internal Medicine

## 2017-08-07 DIAGNOSIS — Z1231 Encounter for screening mammogram for malignant neoplasm of breast: Secondary | ICD-10-CM | POA: Diagnosis not present

## 2017-08-10 ENCOUNTER — Encounter: Payer: Self-pay | Admitting: Internal Medicine

## 2017-08-11 NOTE — Telephone Encounter (Signed)
Dr. Brand Males, MD, [facility].C.P would like to let you know that your PFT (Pulmonary Function Test) from 07/09/2017 was normal. He apologies for the delay, and he was glad you email to remind him of the results like he requested.     He plans to do a CPST bike test with EIB challenge with Landis Martins and then return back into the office for a follow up afterwards. Please call the office to schedule the follow up visit with Dr. Brand Males, MD F.C.C.P. At phone [phone] from 8-5pm Monday through Friday with [facility].     Thank you again,

## 2017-08-11 NOTE — Telephone Encounter (Signed)
Let Taziyah Iannuzzi know that PFT 07/09/17 is normal. Apologies for delay. Glad she called because I had told her to call.   Plan Do CPST bike test with EIB challenge with Landis Martins and then return for followup   Dr. Brand Males, M.D., [facility].C.P Pulmonary and Critical Care Medicine Staff Physician, [place] Director - Interstitial Lung Disease  Program  Pulmonary [place] at [place], [place], [address]  Pager: [phone], If no answer or between  15:00h - 7:00h: call [phone] Telephone: [phone]

## 2017-08-11 NOTE — Telephone Encounter (Signed)
MR please review pulmonary function test so we can advise pt.

## 2017-08-20 ENCOUNTER — Telehealth: Payer: Self-pay | Admitting: Internal Medicine

## 2017-08-20 DIAGNOSIS — R0602 Shortness of breath: Secondary | ICD-10-CM

## 2017-08-20 NOTE — Telephone Encounter (Signed)
Pt is requesting PFT results from 07/09/17.  MR please advise. Thanks

## 2017-08-21 NOTE — Telephone Encounter (Signed)
ATC pt, no answer. Left message for pt to call back.  

## 2017-08-21 NOTE — Telephone Encounter (Signed)
See phone noter from end Jan 2019 when we told her PFT was normal and needed her to do a CPST  Dr. Brand Males, M.D., [facility].C.P Pulmonary and Critical Care Medicine Staff Physician, [place] Director - Interstitial Lung Disease  Program  Pulmonary [place] at [place], [place], [address]  Pager: [phone], If no answer or between  15:00h - 7:00h: call [phone] Telephone: [phone]

## 2017-08-21 NOTE — Telephone Encounter (Signed)
Spoke with patient. She is aware of results. She wants to do the stress test but she is not sure if her insurance will pay for it. She has already received a bill for $400 for testing already done.   PCCs, please advise if you all have a general ballpark figure for the CPST bike test. Thanks!

## 2017-08-21 NOTE — Telephone Encounter (Signed)
I called [facility] & spoke to [facility] who does the scheduling for the CPX.  She wasn't sure & she checked with someone there who told her the facility charge is $500-$600 and then there is a charge for the doc to read the study & that can be anywhere between $200-$350.

## 2017-08-24 NOTE — Telephone Encounter (Signed)
lmtcb X2 for pt to provide estimated cost of stress test.

## 2017-08-24 NOTE — Telephone Encounter (Signed)
Spoke with pt, aware of ballpark costs.  Pt is concerned about the cost of these tests and was asking about payment plans.  I advised pt that I am clinical and do not have access to payment policies, and advised her to contact billing dept regarding this.  Pt expressed understanding.  Pt would like an explanation as to why she is needing this stress test d/t the cost concern.    MR please advise.  Thanks.

## 2017-08-24 NOTE — Telephone Encounter (Signed)
Pt is calling back [phone]

## 2017-08-26 NOTE — Telephone Encounter (Signed)
We can certainly wait and watch without CPST due to cost concerns. However, I would like for her to atleast get a cxr 2 view given dyspnea  Thanks  Dr. Brand Males, M.D., [facility].C.P Pulmonary and Critical Care Medicine Staff Physician, [place] Director - Interstitial Lung Disease  Program  Pulmonary [place] at [place], [place], [address]  Pager: [phone], If no answer or between  15:00h - 7:00h: call [phone] Telephone: [phone]

## 2017-08-27 NOTE — Telephone Encounter (Signed)
Pt is aware of below message and voiced her understanding. CXR has been ordered. Nothing further is needed.

## 2017-08-27 NOTE — Telephone Encounter (Signed)
ATC pt, no answer. Left message for pt to call back.  

## 2017-09-03 ENCOUNTER — Ambulatory Visit (INDEPENDENT_AMBULATORY_CARE_PROVIDER_SITE_OTHER)
Admission: RE | Admit: 2017-09-03 | Discharge: 2017-09-03 | Disposition: A | Discharge status: Home / Self Care | Payer: Medicare Other | Source: Ambulatory Visit | Attending: Internal Medicine | Admitting: Internal Medicine

## 2017-09-03 DIAGNOSIS — R0602 Shortness of breath: Secondary | ICD-10-CM

## 2017-10-19 DIAGNOSIS — L565 Disseminated superficial actinic porokeratosis (DSAP): Secondary | ICD-10-CM | POA: Diagnosis not present

## 2017-10-19 DIAGNOSIS — L821 Other seborrheic keratosis: Secondary | ICD-10-CM | POA: Diagnosis not present

## 2017-10-19 DIAGNOSIS — R7301 Impaired fasting glucose: Secondary | ICD-10-CM | POA: Diagnosis not present

## 2017-10-19 DIAGNOSIS — E7849 Other hyperlipidemia: Secondary | ICD-10-CM | POA: Diagnosis not present

## 2017-10-19 DIAGNOSIS — M859 Disorder of bone density and structure, unspecified: Secondary | ICD-10-CM | POA: Diagnosis not present

## 2017-10-19 DIAGNOSIS — D539 Nutritional anemia, unspecified: Secondary | ICD-10-CM | POA: Diagnosis not present

## 2017-10-19 DIAGNOSIS — Z85828 Personal history of other malignant neoplasm of skin: Secondary | ICD-10-CM | POA: Diagnosis not present

## 2017-10-19 DIAGNOSIS — R82998 Other abnormal findings in urine: Secondary | ICD-10-CM | POA: Diagnosis not present

## 2017-10-19 DIAGNOSIS — D1801 Hemangioma of skin and subcutaneous tissue: Secondary | ICD-10-CM | POA: Diagnosis not present

## 2017-10-26 DIAGNOSIS — J3089 Other allergic rhinitis: Secondary | ICD-10-CM | POA: Diagnosis not present

## 2017-10-26 DIAGNOSIS — J449 Chronic obstructive pulmonary disease, unspecified: Secondary | ICD-10-CM | POA: Diagnosis not present

## 2017-10-26 DIAGNOSIS — Z6821 Body mass index (BMI) 21.0-21.9, adult: Secondary | ICD-10-CM | POA: Diagnosis not present

## 2017-10-26 DIAGNOSIS — J209 Acute bronchitis, unspecified: Secondary | ICD-10-CM | POA: Diagnosis not present

## 2017-10-26 DIAGNOSIS — Z0000 Encounter for general adult medical examination without abnormal findings: Secondary | ICD-10-CM | POA: Diagnosis not present

## 2017-10-26 DIAGNOSIS — Z1389 Encounter for screening for other disorder: Secondary | ICD-10-CM | POA: Diagnosis not present

## 2017-10-26 DIAGNOSIS — D539 Nutritional anemia, unspecified: Secondary | ICD-10-CM | POA: Diagnosis not present

## 2017-10-26 DIAGNOSIS — R05 Cough: Secondary | ICD-10-CM | POA: Diagnosis not present

## 2017-10-26 DIAGNOSIS — H9190 Unspecified hearing loss, unspecified ear: Secondary | ICD-10-CM | POA: Diagnosis not present

## 2017-10-26 DIAGNOSIS — Z1212 Encounter for screening for malignant neoplasm of rectum: Secondary | ICD-10-CM | POA: Diagnosis not present

## 2017-10-26 DIAGNOSIS — E7849 Other hyperlipidemia: Secondary | ICD-10-CM | POA: Diagnosis not present

## 2017-10-26 DIAGNOSIS — M545 Low back pain: Secondary | ICD-10-CM | POA: Diagnosis not present

## 2017-10-26 DIAGNOSIS — M8580 Other specified disorders of bone density and structure, unspecified site: Secondary | ICD-10-CM | POA: Diagnosis not present

## 2017-11-11 ENCOUNTER — Encounter: Payer: Self-pay | Admitting: Internal Medicine

## 2017-11-17 DIAGNOSIS — R040 Epistaxis: Secondary | ICD-10-CM | POA: Diagnosis not present

## 2017-12-23 DIAGNOSIS — R040 Epistaxis: Secondary | ICD-10-CM | POA: Diagnosis not present

## 2018-01-11 ENCOUNTER — Other Ambulatory Visit: Payer: Self-pay

## 2018-01-11 ENCOUNTER — Ambulatory Visit (AMBULATORY_SURGERY_CENTER): Payer: Self-pay

## 2018-01-11 VITALS — Ht 61.5 in | Wt 114.6 lb

## 2018-01-11 DIAGNOSIS — Z86010 Personal history of colonic polyps: Secondary | ICD-10-CM

## 2018-01-11 MED ORDER — NA SULFATE-K SULFATE-MG SULF 17.5-3.13-1.6 GM/177ML PO SOLN: 1.0000 | Freq: Once | ORAL | 0 refills | Status: AC

## 2018-01-11 NOTE — Progress Notes (Signed)
Denies allergies to eggs or soy products. Denies complication of anesthesia or sedation. Denies use of weight loss medication. Denies use of O2.   Emmi instructions declined.  

## 2018-01-12 LAB — PULMONARY FUNCTION TEST
DL/VA % PRED: 106 %
DL/VA: 4.7 ml/min/mmHg/L
DLCO COR: 18.26 ml/min/mmHg
DLCO UNC % PRED: 90 %
DLCO cor % pred: 90 %
DLCO unc: 18.21 ml/min/mmHg
FEF 25-75 PRE: 1.16 L/s
FEF 25-75 Post: 1.2 L/sec
FEF2575-%CHANGE-POST: 3 %
FEF2575-%PRED-POST: 84 %
FEF2575-%PRED-PRE: 81 %
FEV1-%Change-Post: 1 %
FEV1-%PRED-PRE: 89 %
FEV1-%Pred-Post: 90 %
FEV1-Post: 1.62 L
FEV1-Pre: 1.6 L
FEV1FVC-%Change-Post: 7 %
FEV1FVC-%PRED-PRE: 98 %
FEV6-%CHANGE-POST: -5 %
FEV6-%PRED-POST: 90 %
FEV6-%Pred-Pre: 95 %
FEV6-Post: 2.05 L
FEV6-Pre: 2.17 L
FEV6FVC-%Pred-Post: 105 %
FEV6FVC-%Pred-Pre: 105 %
FVC-%Change-Post: -5 %
FVC-%Pred-Post: 85 %
FVC-%Pred-Pre: 90 %
FVC-Post: 2.05 L
FVC-Pre: 2.17 L
POST FEV1/FVC RATIO: 79 %
PRE FEV1/FVC RATIO: 74 %
Post FEV6/FVC ratio: 100 %
Pre FEV6/FVC Ratio: 100 %
RV % pred: 103 %
RV: 2.25 L
TLC % pred: 97 %
TLC: 4.51 L

## 2018-01-25 ENCOUNTER — Encounter: Payer: Self-pay | Admitting: Internal Medicine

## 2018-01-25 ENCOUNTER — Ambulatory Visit (AMBULATORY_SURGERY_CENTER): Payer: Medicare Other | Admitting: Internal Medicine

## 2018-01-25 VITALS — BP 131/51 | HR 49 | Temp 98.0°F | Resp 12 | Ht 61.5 in | Wt 113.0 lb

## 2018-01-25 DIAGNOSIS — Z1211 Encounter for screening for malignant neoplasm of colon: Secondary | ICD-10-CM | POA: Diagnosis not present

## 2018-01-25 DIAGNOSIS — Z86010 Personal history of colonic polyps: Secondary | ICD-10-CM | POA: Diagnosis not present

## 2018-01-25 DIAGNOSIS — D120 Benign neoplasm of cecum: Secondary | ICD-10-CM | POA: Diagnosis not present

## 2018-01-25 MED ORDER — SODIUM CHLORIDE 0.9 % IV SOLN
500.0000 mL | Freq: Once | INTRAVENOUS | Status: DC
Start: 2018-01-25 — End: 2018-04-13

## 2018-01-25 NOTE — Op Note (Signed)
[name] Patient Name: Cindy Valencia Procedure Date: 01/25/2018 9:07 AM MRN: [id number] Endoscopist: Docia Chuck. Henrene Pastor , MD Age: 77 Referring MD:  Date of Birth: 09/09/1940 Gender: Female Account #: [id number] Procedure:                Colonoscopy, with cold snare polypectomy x 3 Indications:              High risk colon cancer surveillance: Personal                            history of adenoma with villous component. Previous                            examinations 2006, 2009 (overdue for follow-up) Medicines:                Monitored Anesthesia Care Procedure:                Pre-Anesthesia Assessment:                           - Prior to the procedure, a History and Physical                            was performed, and patient medications and                            allergies were reviewed. The patient's tolerance of                            previous anesthesia was also reviewed. The risks                            and benefits of the procedure and the sedation                            options and risks were discussed with the patient.                            All questions were answered, and informed consent                            was obtained. Prior Anticoagulants: The patient has                            taken no previous anticoagulant or antiplatelet                            agents. ASA Grade Assessment: II - A patient with                            mild systemic disease. After reviewing the risks                            and benefits, the patient was deemed in  satisfactory condition to undergo the procedure.                           After obtaining informed consent, the colonoscope                            was passed under direct vision. Throughout the                            procedure, the patient's blood pressure, pulse, and                            oxygen saturations were monitored continuously. The                  Colonoscope was introduced through the anus and                            advanced to the the cecum, identified by                            appendiceal orifice and ileocecal valve. The                            ileocecal valve, appendiceal orifice, and rectum                            were photographed. The quality of the bowel                            preparation was excellent. The colonoscopy was                            performed without difficulty. The patient tolerated                            the procedure well. The bowel preparation used was                            SUPREP. Scope In: 9:17:27 AM Scope Out: 9:32:14 AM Scope Withdrawal Time: 0 hours 12 minutes 9 seconds  Total Procedure Duration: 0 hours 14 minutes 47 seconds  Findings:                 Three polyps were found in the cecum. The polyps                            were 2 to 3 mm in size. These polyps were removed                            with a cold snare. Resection and retrieval were                            complete.  The exam was otherwise without abnormality on                            direct and retroflexion views. Complications:            No immediate complications. Estimated blood loss:                            None. Estimated Blood Loss:     Estimated blood loss: none. Impression:               - Three 2 to 3 mm polyps in the cecum, removed with                            a cold snare. Resected and retrieved.                           - The examination was otherwise normal on direct                            and retroflexion views. Recommendation:           - Repeat colonoscopy in 5 years for surveillance.                           - Patient has a contact number available for                            emergencies. The signs and symptoms of potential                            delayed complications were discussed with the                             patient. Return to normal activities tomorrow.                            Written discharge instructions were provided to the                            patient.                           - Resume previous diet.                           - Continue present medications.                           - Await pathology results. Docia Chuck. Henrene Pastor, MD 01/25/2018 9:37:57 AM This report has been signed electronically.

## 2018-01-25 NOTE — Progress Notes (Signed)
Pt's states no medical or surgical changes since previsit or office visit. 

## 2018-01-25 NOTE — Patient Instructions (Signed)
Handouts given:  Polyps   YOU HAD AN ENDOSCOPIC PROCEDURE TODAY AT THE [facility]:   Refer to the procedure report that was given to you for any specific questions about what was found during the examination.  If the procedure report does not answer your questions, please call your gastroenterologist to clarify.  If you requested that your care partner not be given the details of your procedure findings, then the procedure report has been included in a sealed envelope for you to review at your convenience later.  YOU SHOULD EXPECT: Some feelings of bloating in the abdomen. Passage of more gas than usual.  Walking can help get rid of the air that was put into your GI tract during the procedure and reduce the bloating. If you had a lower endoscopy (such as a colonoscopy or flexible sigmoidoscopy) you may notice spotting of blood in your stool or on the toilet paper. If you underwent a bowel prep for your procedure, you may not have a normal bowel movement for a few days.  Please Note:  You might notice some irritation and congestion in your nose or some drainage.  This is from the oxygen used during your procedure.  There is no need for concern and it should clear up in a day or so.  SYMPTOMS TO REPORT IMMEDIATELY:   Following lower endoscopy (colonoscopy or flexible sigmoidoscopy):  Excessive amounts of blood in the stool  Significant tenderness or worsening of abdominal pains  Swelling of the abdomen that is new, acute  Fever of 100F or higher   For urgent or emergent issues, a gastroenterologist can be reached at any hour by calling [phone].   DIET:  We do recommend a small meal at first, but then you may proceed to your regular diet.  Drink plenty of fluids but you should avoid alcoholic beverages for 24 hours.  ACTIVITY:  You should plan to take it easy for the rest of today and you should NOT DRIVE or use heavy machinery until tomorrow (because of the sedation  medicines used during the test).    FOLLOW UP: Our staff will call the number listed on your records the next business day following your procedure to check on you and address any questions or concerns that you may have regarding the information given to you following your procedure. If we do not reach you, we will leave a message.  However, if you are feeling well and you are not experiencing any problems, there is no need to return our call.  We will assume that you have returned to your regular daily activities without incident.  If any biopsies were taken you will be contacted by phone or by letter within the next 1-3 weeks.  Please call us at [phone] if you have not heard about the biopsies in 3 weeks.    SIGNATURES/CONFIDENTIALITY: You and/or your care partner have signed paperwork which will be entered into your electronic medical record.  These signatures attest to the fact that that the information above on your After Visit Summary has been reviewed and is understood.  Full responsibility of the confidentiality of this discharge information lies with you and/or your care-partner. 

## 2018-01-25 NOTE — Progress Notes (Signed)
Called to room to assist during endoscopic procedure.  Patient ID and intended procedure confirmed with present staff. Received instructions for my participation in the procedure from the performing physician.  

## 2018-01-25 NOTE — Progress Notes (Signed)
A and O x3. Report to RN. Tolerated MAC anesthesia well.

## 2018-01-26 ENCOUNTER — Telehealth: Payer: Self-pay

## 2018-01-26 NOTE — Telephone Encounter (Signed)
  Follow up Call-  Call back number 01/25/2018  Post procedure Call Back phone  # [phone]  Permission to leave phone message Yes  Some recent data might be hidden     Patient questions:  Do you have a fever, pain , or abdominal swelling? No. Pain Score  0 *  Have you tolerated food without any problems? Yes.    Have you been able to return to your normal activities? Yes.    Do you have any questions about your discharge instructions: Diet   No. Medications  No. Follow up visit  No.  Do you have questions or concerns about your Care? Yes.  Stated that "tongue on left side is black and blue"; also stated she will call us again if no improvement over the next few days and that "it doesn't hurt".  Actions: * If pain score is 4 or above: No action needed, pain <4.

## 2018-02-01 ENCOUNTER — Encounter: Payer: Self-pay | Admitting: Internal Medicine

## 2018-03-25 ENCOUNTER — Other Ambulatory Visit: Payer: Self-pay

## 2018-03-25 ENCOUNTER — Ambulatory Visit: Payer: Medicare Other | Admitting: Certified Nurse Midwife

## 2018-03-25 DIAGNOSIS — Z79890 Hormone replacement therapy (postmenopausal): Secondary | ICD-10-CM

## 2018-03-25 DIAGNOSIS — M8580 Other specified disorders of bone density and structure, unspecified site: Secondary | ICD-10-CM

## 2018-03-25 NOTE — Telephone Encounter (Signed)
Medication refill request: estradiol 0.25 mg  Progesterone 56 mg  Last AEX:  03/24/17 Next AEX: 04/13/18  Last MMG (if hormonal medication request): 08/14/17 Bi-rads category 1 neg Refill authorized: Please refill until appointment.

## 2018-03-26 MED ORDER — NONFORMULARY OR COMPOUNDED ITEM: 0 refills | Status: DC

## 2018-03-26 NOTE — Telephone Encounter (Signed)
Patient notified rx was sent to the pharmacy. Pharmacy was called & verified rx was received.

## 2018-03-26 NOTE — Telephone Encounter (Signed)
Patient called to check on the status of her rx. Patient states she will be going out of town & will be out of medication. Patient is aware that her rx has been sent to the provider for review.

## 2018-04-13 ENCOUNTER — Other Ambulatory Visit (HOSPITAL_COMMUNITY)
Admission: RE | Admit: 2018-04-13 | Discharge: 2018-04-13 | Disposition: A | Discharge status: Home / Self Care | Payer: Medicare Other | Source: Ambulatory Visit | Attending: Certified Nurse Midwife | Admitting: Certified Nurse Midwife

## 2018-04-13 ENCOUNTER — Encounter: Payer: Self-pay | Admitting: Certified Nurse Midwife

## 2018-04-13 ENCOUNTER — Other Ambulatory Visit: Payer: Self-pay

## 2018-04-13 ENCOUNTER — Ambulatory Visit (INDEPENDENT_AMBULATORY_CARE_PROVIDER_SITE_OTHER): Payer: Medicare Other | Admitting: Certified Nurse Midwife

## 2018-04-13 VITALS — BP 122/78 | HR 70 | Resp 16 | Ht 60.75 in | Wt 113.0 lb

## 2018-04-13 DIAGNOSIS — Z124 Encounter for screening for malignant neoplasm of cervix: Secondary | ICD-10-CM | POA: Insufficient documentation

## 2018-04-13 DIAGNOSIS — N898 Other specified noninflammatory disorders of vagina: Secondary | ICD-10-CM

## 2018-04-13 DIAGNOSIS — N951 Menopausal and female climacteric states: Secondary | ICD-10-CM | POA: Diagnosis not present

## 2018-04-13 DIAGNOSIS — Z01419 Encounter for gynecological examination (general) (routine) without abnormal findings: Secondary | ICD-10-CM | POA: Diagnosis not present

## 2018-04-13 DIAGNOSIS — Z79890 Hormone replacement therapy (postmenopausal): Secondary | ICD-10-CM

## 2018-04-13 NOTE — Addendum Note (Signed)
Addended by: Regina Eck on: 04/13/2018 05:00 PM   Modules accepted: Orders

## 2018-04-13 NOTE — Progress Notes (Signed)
77 y.o. G18P0002 Married  Caucasian Fe here for annual exam. Post menopausal, denies vaginal bleeding or dryness issues.  No height change over last year and only 3 pound weight gain per scales today. Sees Dr. Joylene Draft yearly, for labs, medication management of migraine and cholesterol. Still having occasional hot flashes and trying to decide on stopping HRT. BMD due soon and will have at PCP office. Staying active, just came back from beach vacation. Eating healthy and exercises. No other health issues today.   Patient's last menstrual period was 03/01/2013.          Sexually active: No.  The current method of family planning is post menopausal status.    Exercising: Yes.    workout twice weekly, bike, walking Smoker:  no  Review of Systems  Constitutional: Negative.   HENT: Negative.   Eyes: Negative.   Respiratory: Negative.   Cardiovascular: Negative.   Gastrointestinal: Negative.   Genitourinary: Negative.   Musculoskeletal: Negative.   Skin: Negative.   Neurological: Negative.   Endo/Heme/Allergies: Negative.   Psychiatric/Behavioral: Negative.     Health Maintenance: Pap:  03-10-16 neg History of Abnormal Pap: no MMG:  08-07-17 category c density birads 1:neg Self Breast exams: no Colonoscopy:  2019 polyp f/u 38yrs  BMD:   2016 TDaP:  2013 Shingles: 2013 Pneumonia: 2016 Hep C and HIV: not done Labs: PCP   reports that she quit smoking about 50 years ago. Her smoking use included cigarettes. She started smoking about 59 years ago. She quit after 9.00 years of use. She has never used smokeless tobacco. She reports that she drinks about 1.0 standard drinks of alcohol per week. She reports that she does not use drugs.  Past Medical History:  Diagnosis Date  . Allergy   . Arthritis   . Cataract   . Colon polyp 6/09  . COPD (chronic obstructive pulmonary disease) ([place])   . Endometrial polyp 12/07  . Hyperlipidemia   . IBS (irritable bowel syndrome)   . Lipoma 2000   right  upper chest wall  . Migraine headache   . Osteopenia   . Skin cancer   . Skin cancer     Past Surgical History:  Procedure Laterality Date  . DILATATION & CURRETTAGE/HYSTEROSCOPY WITH RESECTOCOPE N/A 05/03/2013   Procedure: [place];  Surgeon: Lyman Speller, MD;  Location: [facility];  Service: Gynecology;  Laterality: N/A;  . MOLE REMOVAL    . TONSILLECTOMY AND ADENOIDECTOMY    . VAGINAL DELIVERY     x2     Current Outpatient Medications  Medication Sig Dispense Refill  . Biotin 5000 MCG CAPS Take 5,000 mcg by mouth every evening.    . Cholecalciferol (VITAMIN D3 PO) Take by mouth.    . Docosahexaenoic Acid (DHA) 200 MG CAPS Take 200 mg by mouth daily.    Marland Kitchen EVENING PRIMROSE OIL PO Take 1,300 mg by mouth 2 (two) times daily.    Marland Kitchen ezetimibe (ZETIA) 10 MG tablet   10  . fish oil-omega-3 fatty acids 1000 MG capsule Take 1 g by mouth daily.    Marland Kitchen MAGNESIUM CITRATE PO Take 150 mg by mouth every evening.     . Multiple Vitamin (MULTIVITAMIN WITH MINERALS) TABS tablet Take 1 tablet by mouth daily.    . NONFORMULARY OR COMPOUNDED ITEM Estradiol 0.25mg  and Progesterone 56 mg capsule.  One daily. 90 each 0  . OVER THE COUNTER MEDICATION Take 1-2 tablets by mouth 2 (two) times  daily. Dark Cherry (vitamin) 1 in AM and 2 in PM     Current Facility-Administered Medications  Medication Dose Route Frequency Provider Last Rate Last Dose  . 0.9 %  sodium chloride infusion  500 mL Intravenous Once Irene Shipper, MD        Family History  Problem Relation Age of Onset  . Lung cancer Father   . Heart disease Father   . Heart attack Mother   . Osteoarthritis Mother   . Heart disease Maternal Grandfather   . Heart disease Maternal Grandmother   . Osteoporosis Sister   . Colon cancer Neg Hx   . Esophageal cancer Neg Hx   . Liver cancer Neg Hx   . Pancreatic cancer Neg Hx   . Rectal cancer Neg Hx   . Stomach cancer Neg Hx     ROS:  Pertinent  items are noted in HPI.  Otherwise, a comprehensive ROS was negative.  Exam:   LMP 03/01/2013    Ht Readings from Last 3 Encounters:  01/25/18 5' 1.5" (1.562 m)  01/11/18 5' 1.5" (1.562 m)  06/10/17 5' 1.5" (1.562 m)    General appearance: alert, cooperative and appears stated age Head: Normocephalic, without obvious abnormality, atraumatic Neck: no adenopathy, supple, symmetrical, trachea midline and thyroid normal to inspection and palpation Lungs: clear to auscultation bilaterally Breasts: normal appearance, no masses or tenderness, No nipple retraction or dimpling, No nipple discharge or bleeding, No axillary or supraclavicular adenopathy Heart: regular rate and rhythm Abdomen: soft, non-tender; no masses,  no organomegaly Extremities: extremities normal, atraumatic, no cyanosis or edema Skin: Skin color, texture, turgor normal. No rashes or lesions Lymph nodes: Cervical, supraclavicular, and axillary nodes normal. No abnormal inguinal nodes palpated Neurologic: Grossly normal   Pelvic: External genitalia:  no lesions              Urethra:  normal appearing urethra with no masses, tenderness or lesions              Bartholin's and Skene's: normal                 Vagina: normal appearing vagina with normal color and discharge, no lesions              Cervix: no cervical motion tenderness, no lesions and normal appearance              Pap taken: No. Bimanual Exam:  Uterus:  normal size, contour, position, consistency, mobility, non-tender and anteverted              Adnexa: normal adnexa and no mass, fullness, tenderness               Rectovaginal: Confirms               Anus:  normal sphincter tone, no lesions  Chaperone present: yes  A:  Well Woman with normal exam  Post menopausal on HRT, not sure she wants to continue,   Osteopenia due for BMD with PCP  Vaginal dryness using OTC as needed  Cholesterol management with PCP  P:   Reviewed health and wellness pertinent to  exam  Discussed risks/benefits/expectations of HRT. Patient has refill on Rx and will decide if she wants to continue. Discussed at length expectations and risk with increase age.  Patient to request copy of BMD to be sent here.  Continue with OTC coconut oil if needed  Follow up with MD as indicated  Pap smear: no  counseled on breast self exam, mammography screening, feminine hygiene, use and side effects of HRT, adequate intake of calcium and vitamin D, diet and exercise  return annually or prn  An After Visit Summary was printed and given to the patient.

## 2018-04-13 NOTE — Patient Instructions (Signed)

## 2018-04-15 LAB — CYTOLOGY - PAP
DIAGNOSIS: NEGATIVE
HPV (WINDOPATH): NOT DETECTED

## 2018-04-17 DIAGNOSIS — Z23 Encounter for immunization: Secondary | ICD-10-CM | POA: Diagnosis not present

## 2018-05-10 DIAGNOSIS — H35372 Puckering of macula, left eye: Secondary | ICD-10-CM | POA: Diagnosis not present

## 2018-05-10 DIAGNOSIS — H52203 Unspecified astigmatism, bilateral: Secondary | ICD-10-CM | POA: Diagnosis not present

## 2018-05-10 DIAGNOSIS — H2513 Age-related nuclear cataract, bilateral: Secondary | ICD-10-CM | POA: Diagnosis not present

## 2018-06-15 ENCOUNTER — Ambulatory Visit: Payer: Medicare Other | Admitting: Sports Medicine

## 2018-06-17 ENCOUNTER — Encounter: Payer: Self-pay | Admitting: Sports Medicine

## 2018-06-17 ENCOUNTER — Ambulatory Visit (INDEPENDENT_AMBULATORY_CARE_PROVIDER_SITE_OTHER): Payer: Medicare Other | Admitting: Sports Medicine

## 2018-06-17 VITALS — BP 112/68 | Ht 61.5 in | Wt 110.0 lb

## 2018-06-17 DIAGNOSIS — M7742 Metatarsalgia, left foot: Secondary | ICD-10-CM | POA: Diagnosis not present

## 2018-06-17 DIAGNOSIS — M7741 Metatarsalgia, right foot: Secondary | ICD-10-CM

## 2018-06-17 DIAGNOSIS — Q667 Congenital pes cavus, unspecified foot: Secondary | ICD-10-CM

## 2018-06-17 NOTE — Progress Notes (Signed)
  Cindy Valencia - 77 y.o. female MRN [id number]  Date of birth: July 25, 1940    SUBJECTIVE:      Chief Complaint:/ HPI:  77 year old female with history of metatarsalgia returns for adjustment of her orthotics.  She currently has size 6 Smart Cell orthotics however she notes that in some of her sneakers these make her shoes feel very small.  As result she has bought a Advice worker on her size and half larger to accommodate from her room.   ROS:     See HPI  PERTINENT  PMH / PSH FH / / SH:  Past Medical, Surgical, Social, and Family History Reviewed & Updated in the EMR.    OBJECTIVE: BP 112/68   Ht 5' 1.5" (1.562 m)   Wt 110 lb (49.9 kg)   LMP 03/01/2013   BMI 20.45 kg/m   Physical Exam:  Vital signs are reviewed.  GEN: Alert and oriented, NAD Pulm: Breathing unlabored PSY: normal mood, congruent affect  MSK: Bilateral feet: Inspection: Mild pes cavus bilaterally.  Reduced fat pads over the metatarsal heads Palpation: Mild tenderness over the metatarsal heads. ROM: Full  ROM of the ankle. Normal midfoot flexibility Strength: 5/5 strength    ASSESSMENT & PLAN:  1.  Bilateral metatarsalgia and mild pes cavus  Patient was fitted for a : 1/16" dress shoe orthotic. The orthotic was heated and afterward the patient stood on the orthotic blank positioned on the orthotic stand. The patient was positioned in subtalar neutral position and 10 degrees of ankle dorsiflexion in a weight bearing stance. After completion of molding, a stable base was applied to the orthotic blank. The blank was ground to a stable position for weight bearing. Size: 28F Base: cork base Posting: none Additional orthotic padding: blue Poron applied as metatarsal bar  Patient reports significant improvement in comfort in her normal size sneakers.    Total time spent with the patient was 25 minutes with greater than 50% of the time spent in face-to-face consultation discussing orthotic construction,  instruction, and sitting. Gait was neutral with orthotics in place. Patient found them to be comfortable. Follow-up as needed.  I observed and examined the patient with the [facility] Fellow and agree with assessment and plan.  Note reviewed and modified by me. Stefanie Libel, MD

## 2018-06-18 ENCOUNTER — Encounter: Payer: Self-pay | Admitting: Sports Medicine

## 2018-06-30 ENCOUNTER — Other Ambulatory Visit: Payer: Self-pay

## 2018-06-30 DIAGNOSIS — Z79890 Hormone replacement therapy (postmenopausal): Secondary | ICD-10-CM

## 2018-06-30 DIAGNOSIS — M8580 Other specified disorders of bone density and structure, unspecified site: Secondary | ICD-10-CM

## 2018-06-30 NOTE — Telephone Encounter (Signed)
Left a message to clairify if patient wants to continue HRT.

## 2018-07-01 NOTE — Telephone Encounter (Signed)
Routing to Cisco, CNM.

## 2018-07-01 NOTE — Telephone Encounter (Signed)
Patient would like to speak directly to Debbi regarding HRT medication. Patient is aware that Debbi is seeing patients. States that at this time she would like to continue medication.

## 2018-07-02 NOTE — Telephone Encounter (Signed)
Ok to restart at this time

## 2018-07-05 MED ORDER — NONFORMULARY OR COMPOUNDED ITEM
0 refills | Status: DC
Start: 2018-07-05 — End: 2019-04-19

## 2018-07-05 MED ORDER — NONFORMULARY OR COMPOUNDED ITEM: 0 refills | Status: DC

## 2018-07-05 NOTE — Telephone Encounter (Signed)
Left message to call Warren Lindahl, RN at [facility] [phone].   

## 2018-07-05 NOTE — Telephone Encounter (Signed)
Printed RX to Dr. Quincy Simmonds for signature.

## 2018-07-05 NOTE — Addendum Note (Signed)
Addended by: Burnice Logan on: 07/05/2018 01:55 PM   Modules accepted: Orders

## 2018-07-05 NOTE — Telephone Encounter (Signed)
Patient returned call to nurse Jill. °

## 2018-07-05 NOTE — Telephone Encounter (Signed)
Spoke with patient. Advised per Melvia Heaps, CNM. Patient never stopped HRT, will plan to try to wean off in Spring 2020. Patient has 5 days of RX left, requesting refill of compounded Estradiol 0.25mg  and Progesterone 56mg  caps to [facility].   Last AEX 04/13/18 MMG 08/14/17: Bi-rads 1: neg  Advised will review with covering provider and return call. Patient agreeable.   Rx pended.   Dr. Quincy Simmonds -please advise on refill

## 2018-07-05 NOTE — Telephone Encounter (Signed)
Rx faxed to [facility].   Patient notified of refill, left detailed message on mobile number, ok per dpr. Return call to office if any additional questions/concerns.

## 2018-07-12 ENCOUNTER — Other Ambulatory Visit: Payer: Self-pay | Admitting: Internal Medicine

## 2018-07-12 DIAGNOSIS — Z1231 Encounter for screening mammogram for malignant neoplasm of breast: Secondary | ICD-10-CM

## 2018-08-03 DIAGNOSIS — Z6821 Body mass index (BMI) 21.0-21.9, adult: Secondary | ICD-10-CM | POA: Diagnosis not present

## 2018-08-03 DIAGNOSIS — J069 Acute upper respiratory infection, unspecified: Secondary | ICD-10-CM | POA: Diagnosis not present

## 2018-08-03 DIAGNOSIS — R05 Cough: Secondary | ICD-10-CM | POA: Diagnosis not present

## 2018-08-03 DIAGNOSIS — J209 Acute bronchitis, unspecified: Secondary | ICD-10-CM | POA: Diagnosis not present

## 2018-08-10 ENCOUNTER — Ambulatory Visit: Payer: Medicare Other

## 2018-08-12 ENCOUNTER — Ambulatory Visit
Admission: RE | Admit: 2018-08-12 | Discharge: 2018-08-12 | Disposition: A | Discharge status: Home / Self Care | Payer: Medicare Other | Source: Ambulatory Visit | Attending: Internal Medicine | Admitting: Internal Medicine

## 2018-08-12 DIAGNOSIS — Z1231 Encounter for screening mammogram for malignant neoplasm of breast: Secondary | ICD-10-CM

## 2018-08-16 DIAGNOSIS — L570 Actinic keratosis: Secondary | ICD-10-CM | POA: Diagnosis not present

## 2018-08-16 DIAGNOSIS — D485 Neoplasm of uncertain behavior of skin: Secondary | ICD-10-CM | POA: Diagnosis not present

## 2018-08-16 DIAGNOSIS — L821 Other seborrheic keratosis: Secondary | ICD-10-CM | POA: Diagnosis not present

## 2018-08-16 DIAGNOSIS — D2222 Melanocytic nevi of left ear and external auricular canal: Secondary | ICD-10-CM | POA: Diagnosis not present

## 2018-08-16 DIAGNOSIS — Z85828 Personal history of other malignant neoplasm of skin: Secondary | ICD-10-CM | POA: Diagnosis not present

## 2018-10-05 ENCOUNTER — Telehealth: Payer: Self-pay | Admitting: Certified Nurse Midwife

## 2018-10-05 NOTE — Telephone Encounter (Signed)
Return call to patient. States at last annual 04-13-18, Debbi discussed discontinuing HRT.  Patient states she is calling for instructions on how to wean down from this.  Has 5 pills left of compounded Estradiol 0.25 and progesterone 56 mg. Advised Debbi is out of office, will route to covering provider for instructions.

## 2018-10-05 NOTE — Telephone Encounter (Signed)
Call to patient. Advised of instructions from Dr Sabra Heck. Patient states she is unsure about stopping this medication.  Offered consult to discuss ( by phone due to Covid 19) if has concerns. Discussed benefits may no longer out weigh risk but can discuss with provider if desires. Can also call if has side effects after stopping.  Patient declines appointment. Will call if needed.  Encounter closed.

## 2018-10-05 NOTE — Telephone Encounter (Signed)
She is on a very low dosage so ok to just stop.

## 2018-10-05 NOTE — Telephone Encounter (Signed)
Patient calling to speak with nurse about how to get off the hormone medication.

## 2018-11-16 DIAGNOSIS — D1801 Hemangioma of skin and subcutaneous tissue: Secondary | ICD-10-CM | POA: Diagnosis not present

## 2018-11-16 DIAGNOSIS — L570 Actinic keratosis: Secondary | ICD-10-CM | POA: Diagnosis not present

## 2018-11-16 DIAGNOSIS — Z85828 Personal history of other malignant neoplasm of skin: Secondary | ICD-10-CM | POA: Diagnosis not present

## 2018-11-16 DIAGNOSIS — L905 Scar conditions and fibrosis of skin: Secondary | ICD-10-CM | POA: Diagnosis not present

## 2018-11-16 DIAGNOSIS — L821 Other seborrheic keratosis: Secondary | ICD-10-CM | POA: Diagnosis not present

## 2018-11-16 DIAGNOSIS — L820 Inflamed seborrheic keratosis: Secondary | ICD-10-CM | POA: Diagnosis not present

## 2018-11-16 DIAGNOSIS — L565 Disseminated superficial actinic porokeratosis (DSAP): Secondary | ICD-10-CM | POA: Diagnosis not present

## 2018-11-16 DIAGNOSIS — D692 Other nonthrombocytopenic purpura: Secondary | ICD-10-CM | POA: Diagnosis not present

## 2018-11-16 DIAGNOSIS — D2239 Melanocytic nevi of other parts of face: Secondary | ICD-10-CM | POA: Diagnosis not present

## 2018-11-16 DIAGNOSIS — L814 Other melanin hyperpigmentation: Secondary | ICD-10-CM | POA: Diagnosis not present

## 2018-11-18 DIAGNOSIS — R7989 Other specified abnormal findings of blood chemistry: Secondary | ICD-10-CM | POA: Diagnosis not present

## 2018-11-18 DIAGNOSIS — D539 Nutritional anemia, unspecified: Secondary | ICD-10-CM | POA: Diagnosis not present

## 2018-11-18 DIAGNOSIS — E7849 Other hyperlipidemia: Secondary | ICD-10-CM | POA: Diagnosis not present

## 2018-11-18 DIAGNOSIS — M859 Disorder of bone density and structure, unspecified: Secondary | ICD-10-CM | POA: Diagnosis not present

## 2018-11-18 DIAGNOSIS — R7301 Impaired fasting glucose: Secondary | ICD-10-CM | POA: Diagnosis not present

## 2018-11-19 DIAGNOSIS — R82998 Other abnormal findings in urine: Secondary | ICD-10-CM | POA: Diagnosis not present

## 2018-11-25 DIAGNOSIS — Z0000 Encounter for general adult medical examination without abnormal findings: Secondary | ICD-10-CM | POA: Diagnosis not present

## 2018-11-25 DIAGNOSIS — J209 Acute bronchitis, unspecified: Secondary | ICD-10-CM | POA: Diagnosis not present

## 2018-11-25 DIAGNOSIS — D539 Nutritional anemia, unspecified: Secondary | ICD-10-CM | POA: Diagnosis not present

## 2018-11-25 DIAGNOSIS — E785 Hyperlipidemia, unspecified: Secondary | ICD-10-CM | POA: Diagnosis not present

## 2018-11-25 DIAGNOSIS — M8580 Other specified disorders of bone density and structure, unspecified site: Secondary | ICD-10-CM | POA: Diagnosis not present

## 2018-11-25 DIAGNOSIS — J449 Chronic obstructive pulmonary disease, unspecified: Secondary | ICD-10-CM | POA: Diagnosis not present

## 2018-11-25 DIAGNOSIS — R7301 Impaired fasting glucose: Secondary | ICD-10-CM | POA: Diagnosis not present

## 2018-11-25 DIAGNOSIS — Z1331 Encounter for screening for depression: Secondary | ICD-10-CM | POA: Diagnosis not present

## 2018-11-25 DIAGNOSIS — D126 Benign neoplasm of colon, unspecified: Secondary | ICD-10-CM | POA: Diagnosis not present

## 2018-11-25 DIAGNOSIS — H9190 Unspecified hearing loss, unspecified ear: Secondary | ICD-10-CM | POA: Diagnosis not present

## 2018-11-25 DIAGNOSIS — Z1339 Encounter for screening examination for other mental health and behavioral disorders: Secondary | ICD-10-CM | POA: Diagnosis not present

## 2018-11-25 DIAGNOSIS — J309 Allergic rhinitis, unspecified: Secondary | ICD-10-CM | POA: Diagnosis not present

## 2019-01-11 ENCOUNTER — Ambulatory Visit (INDEPENDENT_AMBULATORY_CARE_PROVIDER_SITE_OTHER): Payer: Medicare Other | Admitting: Sports Medicine

## 2019-01-11 ENCOUNTER — Other Ambulatory Visit: Payer: Self-pay

## 2019-01-11 DIAGNOSIS — Q667 Congenital pes cavus, unspecified foot: Secondary | ICD-10-CM | POA: Diagnosis not present

## 2019-01-11 NOTE — Assessment & Plan Note (Signed)
Patient was fitted for a : standard, cushioned, semi-rigid orthotic. The orthotic was heated and afterward the patient stood on the orthotic blank positioned on the orthotic stand. The patient was positioned in subtalar neutral position and 10 degrees of ankle dorsiflexion in a weight bearing stance. After completion of molding, a stable base was applied to the orthotic blank. The blank was ground to a stable position for weight bearing. Size: 6 dress orthotic Base: tan med density EVA Posting: MT cushion with poron bilaterally Additional orthotic padding: none  On completion this fit well She seemed neutral in stance She had good comfort with walking

## 2019-01-11 NOTE — Progress Notes (Signed)
CC: Bilateral forefoot pain  Patient with hx of forefoot pain Limiting her ability to walk We made her a special thin, dress orthotic with additional forefoot poron padding This has really helped  Comes for getting another pair of orthoitcs as she is walking more and has shoes that cannot accomodate the older model  ROS Hip pain has resolved No limp No LE weakness  PE Pleasant older F in NAD BP 108/60   Ht 5' 1.5" (1.562 m)   Wt 109 lb (49.4 kg)   LMP 03/01/2013   BMI 20.26 kg/m   Moderately high long arch bilat Bilat flattening and widening of forefoot Hypertrophy of MTP one bilat. Transverse arch plantar surface shows some callusing over MT  Heads Gait is neutral without excess pronation or supination TTP over distal MT shafts

## 2019-03-15 DIAGNOSIS — M8589 Other specified disorders of bone density and structure, multiple sites: Secondary | ICD-10-CM | POA: Diagnosis not present

## 2019-04-16 DIAGNOSIS — Z23 Encounter for immunization: Secondary | ICD-10-CM | POA: Diagnosis not present

## 2019-04-19 ENCOUNTER — Other Ambulatory Visit: Payer: Self-pay

## 2019-04-19 ENCOUNTER — Encounter: Payer: Self-pay | Admitting: Certified Nurse Midwife

## 2019-04-19 ENCOUNTER — Ambulatory Visit (INDEPENDENT_AMBULATORY_CARE_PROVIDER_SITE_OTHER): Payer: Medicare Other | Admitting: Certified Nurse Midwife

## 2019-04-19 VITALS — BP 102/64 | HR 64 | Temp 97.2°F | Resp 16 | Ht 60.75 in | Wt 111.0 lb

## 2019-04-19 DIAGNOSIS — Z01419 Encounter for gynecological examination (general) (routine) without abnormal findings: Secondary | ICD-10-CM | POA: Diagnosis not present

## 2019-04-19 DIAGNOSIS — Z124 Encounter for screening for malignant neoplasm of cervix: Secondary | ICD-10-CM | POA: Diagnosis not present

## 2019-04-19 DIAGNOSIS — Z780 Asymptomatic menopausal state: Secondary | ICD-10-CM | POA: Diagnosis not present

## 2019-04-19 DIAGNOSIS — N952 Postmenopausal atrophic vaginitis: Secondary | ICD-10-CM

## 2019-04-19 DIAGNOSIS — Z659 Problem related to unspecified psychosocial circumstances: Secondary | ICD-10-CM | POA: Diagnosis not present

## 2019-04-19 NOTE — Progress Notes (Signed)
78 y.o. G32P0002 Married  Caucasian Fe here for annual exam. Menopausal went off HRT. Using [facility] for symptom relief of hot flashes, working well. Sees Dr. Joylene Draft for labs, cholesterol, and  migraine management. All medications stable per patient. Feels she has lost weight due to stress. Emotional stress with sister's death from cancer. "We were very close". Denies vaginal bleeding or vaginal dryness that is a problem. Staying active with outdoor exercise. No other health issues today.  Patient's last menstrual period was 03/01/2013.          Sexually active: Yes.    The current method of family planning is post menopausal status.    Exercising: Yes.    walking, hiking & trainer Smoker:  no  Review of Systems  Constitutional: Negative.   HENT: Negative.   Eyes: Negative.   Respiratory: Negative.   Cardiovascular: Negative.   Gastrointestinal: Negative.   Genitourinary: Negative.   Musculoskeletal: Negative.   Skin: Negative.   Neurological: Negative.   Endo/Heme/Allergies: Negative.   Psychiatric/Behavioral: Negative.     Health Maintenance: Pap:  03-10-16 neg, 04-13-18 neg HPV HR neg History of Abnormal Pap: no MMG:  08-12-2018 category c density birads 1:neg Self Breast exams: no Colonoscopy:  2019 polyp f/u 60yrs BMD:   2020 per patient with pcp was suggested to use medication, patient considering TDaP:  2013 Shingles: 2013 Pneumonia: 2016 Hep C and HIV: not done Labs: if needed   reports that she quit smoking about 51 years ago. Her smoking use included cigarettes. She started smoking about 60 years ago. She quit after 9.00 years of use. She has never used smokeless tobacco. She reports current alcohol use of about 4.0 standard drinks of alcohol per week. She reports that she does not use drugs.  Past Medical History:  Diagnosis Date  . Allergy   . Arthritis   . Cataract   . Colon polyp 6/09  . COPD (chronic obstructive pulmonary disease) ([place])   . Endometrial  polyp 12/07  . Hyperlipidemia   . IBS (irritable bowel syndrome)   . Lipoma 2000   right upper chest wall  . Migraine headache   . Osteopenia   . Skin cancer   . Skin cancer     Past Surgical History:  Procedure Laterality Date  . DILATATION & CURRETTAGE/HYSTEROSCOPY WITH RESECTOCOPE N/A 05/03/2013   Procedure: [place];  Surgeon: Lyman Speller, MD;  Location: [facility];  Service: Gynecology;  Laterality: N/A;  . MOLE REMOVAL    . TONSILLECTOMY AND ADENOIDECTOMY    . VAGINAL DELIVERY     x2     Current Outpatient Medications  Medication Sig Dispense Refill  . Biotin 5000 MCG CAPS Take 5,000 mcg by mouth every evening.    . Cholecalciferol (VITAMIN D3 PO) Take by mouth.    . EVENING PRIMROSE OIL PO Take 1,300 mg by mouth 2 (two) times daily.    Marland Kitchen ezetimibe (ZETIA) 10 MG tablet   10  . fish oil-omega-3 fatty acids 1000 MG capsule Take 1 g by mouth daily.    Marland Kitchen MAGNESIUM CITRATE PO Take 150 mg by mouth every evening.     . Multiple Vitamin (MULTIVITAMIN WITH MINERALS) TABS tablet Take 1 tablet by mouth daily.    Marland Kitchen OVER THE COUNTER MEDICATION Take 1-2 tablets by mouth 2 (two) times daily. Tart Cherry (vitamin) 2 in AM and 1 in PM    . SUMAtriptan (IMITREX) 50 MG tablet   3  No current facility-administered medications for this visit.     Family History  Problem Relation Age of Onset  . Lung cancer Father   . Heart disease Father   . Heart attack Mother   . Osteoarthritis Mother   . Heart disease Maternal Grandfather   . Heart disease Maternal Grandmother   . Cancer Sister     ROS:  Pertinent items are noted in HPI.  Otherwise, a comprehensive ROS was negative.  Exam:   BP 102/64   Pulse 64   Temp (!) 97.2 F (36.2 C) (Skin)   Resp 16   Ht 5' 0.75" (1.543 m)   Wt 111 lb (50.3 kg)   LMP 03/01/2013   BMI 21.15 kg/m  Height: 5' 0.75" (154.3 cm) Ht Readings from Last 3 Encounters:  04/19/19 5' 0.75" (1.543 m)   01/11/19 5' 1.5" (1.562 m)  06/17/18 5' 1.5" (1.562 m)    General appearance: alert, cooperative and appears stated age Head: Normocephalic, without obvious abnormality, atraumatic Neck: no adenopathy, supple, symmetrical, trachea midline and thyroid normal to inspection and palpation Lungs: clear to auscultation bilaterally Breasts: normal appearance, no masses or tenderness, No nipple retraction or dimpling, No nipple discharge or bleeding, No axillary or supraclavicular adenopathy Heart: regular rate and rhythm Abdomen: soft, non-tender; no masses,  no organomegaly Extremities: extremities normal, atraumatic, no cyanosis or edema Skin: Skin color, texture, turgor normal. No rashes or lesions Lymph nodes: Cervical, supraclavicular, and axillary nodes normal. No abnormal inguinal nodes palpated Neurologic: Grossly normal   Pelvic: External genitalia:  no lesions, atrophic appearance              Urethra:  normal appearing urethra with no masses, tenderness or lesions              Bartholin's and Skene's: normal                 Vagina: slight atrophic appearing vagina with normal color and discharge, no lesions              Cervix: no cervical motion tenderness, no lesions and normal appearance              Pap taken: No. Bimanual Exam:  Uterus:  normal size, contour, position, consistency, mobility, non-tender and anteverted              Adnexa: normal adnexa and no mass, fullness, tenderness               Rectovaginal: Confirms               Anus:  normal sphincter tone, no lesions  Chaperone present: yes  A:  Well Woman with normal exam  Post menopausal previous HRT use, stopped using black cohosh with good results.  Vaginal dryness  Social stress with sister's death  Cholesterol, labs, BMD,migraine headache management with PCP  P:   Reviewed health and wellness pertinent to exam  Aware of need to advise if vaginal bleeding  Discussed coconut oil use for dryness and sexual  activity. Questions addressed.  Encouraged to seek family and friend support.  Offered by condolences for her loss.  Continue follow up with PCP as indicated.  Pap smear: no   counseled on breast self exam, mammography screening, feminine hygiene, adequate intake of calcium and vitamin D, diet and exercise, Kegel's exercises  return annually or prn  An After Visit Summary was printed and given to the patient.

## 2019-04-19 NOTE — Patient Instructions (Signed)

## 2019-05-02 ENCOUNTER — Other Ambulatory Visit: Payer: Self-pay

## 2019-05-02 DIAGNOSIS — Z20822 Contact with and (suspected) exposure to covid-19: Secondary | ICD-10-CM

## 2019-05-03 LAB — NOVEL CORONAVIRUS, NAA: SARS-CoV-2, NAA: NOT DETECTED

## 2019-06-13 DIAGNOSIS — L821 Other seborrheic keratosis: Secondary | ICD-10-CM | POA: Diagnosis not present

## 2019-06-13 DIAGNOSIS — D692 Other nonthrombocytopenic purpura: Secondary | ICD-10-CM | POA: Diagnosis not present

## 2019-06-13 DIAGNOSIS — L820 Inflamed seborrheic keratosis: Secondary | ICD-10-CM | POA: Diagnosis not present

## 2019-06-13 DIAGNOSIS — D1801 Hemangioma of skin and subcutaneous tissue: Secondary | ICD-10-CM | POA: Diagnosis not present

## 2019-06-13 DIAGNOSIS — Z85828 Personal history of other malignant neoplasm of skin: Secondary | ICD-10-CM | POA: Diagnosis not present

## 2019-06-13 DIAGNOSIS — D225 Melanocytic nevi of trunk: Secondary | ICD-10-CM | POA: Diagnosis not present

## 2019-06-13 DIAGNOSIS — C44319 Basal cell carcinoma of skin of other parts of face: Secondary | ICD-10-CM | POA: Diagnosis not present

## 2019-06-13 DIAGNOSIS — L814 Other melanin hyperpigmentation: Secondary | ICD-10-CM | POA: Diagnosis not present

## 2019-06-23 DIAGNOSIS — H35372 Puckering of macula, left eye: Secondary | ICD-10-CM | POA: Diagnosis not present

## 2019-06-23 DIAGNOSIS — H524 Presbyopia: Secondary | ICD-10-CM | POA: Diagnosis not present

## 2019-06-23 DIAGNOSIS — H2513 Age-related nuclear cataract, bilateral: Secondary | ICD-10-CM | POA: Diagnosis not present

## 2019-06-23 DIAGNOSIS — H5213 Myopia, bilateral: Secondary | ICD-10-CM | POA: Diagnosis not present

## 2019-07-13 ENCOUNTER — Other Ambulatory Visit: Payer: Self-pay | Admitting: Internal Medicine

## 2019-07-13 DIAGNOSIS — Z1231 Encounter for screening mammogram for malignant neoplasm of breast: Secondary | ICD-10-CM

## 2019-07-26 ENCOUNTER — Ambulatory Visit: Payer: Medicare Other | Attending: Internal Medicine

## 2019-07-27 ENCOUNTER — Ambulatory Visit: Payer: Medicare Other | Attending: Internal Medicine

## 2019-07-27 DIAGNOSIS — Z23 Encounter for immunization: Secondary | ICD-10-CM | POA: Insufficient documentation

## 2019-07-27 NOTE — Progress Notes (Signed)
   Covid-19 Vaccination Clinic  Name:  Cindy Valencia    MRN: [id number] DOB: [birth year]  07/27/2019  Cindy Valencia was observed post Covid-19 immunization for 30 minutes based on pre-vaccination screening without incidence. She was provided with Vaccine Information Sheet and instruction to access the V-Safe system.   Cindy Valencia was instructed to call 911 with any severe reactions post vaccine: Marland Kitchen Difficulty breathing  . Swelling of your face and throat  . A fast heartbeat  . A bad rash all over your body  . Dizziness and weakness    Immunizations Administered    Name Date Dose VIS Date Route   Pfizer COVID-19 Vaccine 07/27/2019 11:23 AM 0.3 mL 06/24/2019 Intramuscular   Manufacturer: [place]   Lot: [id number]   [place]: [id number]

## 2019-08-16 ENCOUNTER — Ambulatory Visit: Payer: Medicare Other | Attending: Internal Medicine

## 2019-08-16 DIAGNOSIS — Z23 Encounter for immunization: Secondary | ICD-10-CM | POA: Insufficient documentation

## 2019-08-16 NOTE — Progress Notes (Signed)
   Covid-19 Vaccination Clinic  Name:  Cindy Valencia    MRN: [id number] DOB: [birth year]  08/16/2019  [name] was observed post Covid-19 immunization for 30 minutes based on pre-vaccination screening without incidence. She was provided with Vaccine Information Sheet and instruction to access the V-Safe system.   [name] was instructed to call 911 with any severe reactions post vaccine: Marland Kitchen Difficulty breathing  . Swelling of your face and throat  . A fast heartbeat  . A bad rash all over your body  . Dizziness and weakness    Immunizations Administered    Name Date Dose VIS Date Route   Pfizer COVID-19 Vaccine 08/16/2019  8:43 AM 0.3 mL 06/24/2019 Intramuscular   Manufacturer: [place]   Lot: [id number]   [place]: [id number]

## 2019-08-24 ENCOUNTER — Ambulatory Visit
Admission: RE | Admit: 2019-08-24 | Discharge: 2019-08-24 | Disposition: A | Discharge status: Home / Self Care | Payer: Medicare Other | Source: Ambulatory Visit | Attending: Internal Medicine | Admitting: Internal Medicine

## 2019-08-24 ENCOUNTER — Other Ambulatory Visit: Payer: Self-pay

## 2019-08-24 DIAGNOSIS — Z1231 Encounter for screening mammogram for malignant neoplasm of breast: Secondary | ICD-10-CM

## 2019-08-26 NOTE — Progress Notes (Signed)
Mammogram reviewed negative. Birads 1 Density D Repeat mammogram yearly

## 2019-10-04 ENCOUNTER — Encounter: Payer: Self-pay | Admitting: Certified Nurse Midwife

## 2019-11-30 DIAGNOSIS — D692 Other nonthrombocytopenic purpura: Secondary | ICD-10-CM | POA: Diagnosis not present

## 2019-11-30 DIAGNOSIS — Z85828 Personal history of other malignant neoplasm of skin: Secondary | ICD-10-CM | POA: Diagnosis not present

## 2019-11-30 DIAGNOSIS — D1801 Hemangioma of skin and subcutaneous tissue: Secondary | ICD-10-CM | POA: Diagnosis not present

## 2019-11-30 DIAGNOSIS — D225 Melanocytic nevi of trunk: Secondary | ICD-10-CM | POA: Diagnosis not present

## 2019-11-30 DIAGNOSIS — L821 Other seborrheic keratosis: Secondary | ICD-10-CM | POA: Diagnosis not present

## 2019-11-30 DIAGNOSIS — L814 Other melanin hyperpigmentation: Secondary | ICD-10-CM | POA: Diagnosis not present

## 2019-12-27 DIAGNOSIS — R82998 Other abnormal findings in urine: Secondary | ICD-10-CM | POA: Diagnosis not present

## 2019-12-27 DIAGNOSIS — D539 Nutritional anemia, unspecified: Secondary | ICD-10-CM | POA: Diagnosis not present

## 2020-01-12 DIAGNOSIS — J342 Deviated nasal septum: Secondary | ICD-10-CM | POA: Diagnosis not present

## 2020-01-12 DIAGNOSIS — J343 Hypertrophy of nasal turbinates: Secondary | ICD-10-CM | POA: Diagnosis not present

## 2020-01-12 DIAGNOSIS — J310 Chronic rhinitis: Secondary | ICD-10-CM | POA: Diagnosis not present

## 2020-01-12 DIAGNOSIS — J320 Chronic maxillary sinusitis: Secondary | ICD-10-CM | POA: Diagnosis not present

## 2020-01-13 DIAGNOSIS — Z1212 Encounter for screening for malignant neoplasm of rectum: Secondary | ICD-10-CM | POA: Diagnosis not present

## 2020-02-08 DIAGNOSIS — H903 Sensorineural hearing loss, bilateral: Secondary | ICD-10-CM | POA: Diagnosis not present

## 2020-03-22 ENCOUNTER — Other Ambulatory Visit: Payer: Self-pay | Admitting: Internal Medicine

## 2020-03-22 DIAGNOSIS — E785 Hyperlipidemia, unspecified: Secondary | ICD-10-CM

## 2020-03-26 DIAGNOSIS — Z23 Encounter for immunization: Secondary | ICD-10-CM | POA: Diagnosis not present

## 2020-04-03 ENCOUNTER — Other Ambulatory Visit: Payer: Self-pay

## 2020-04-03 ENCOUNTER — Ambulatory Visit
Admission: RE | Admit: 2020-04-03 | Discharge: 2020-04-03 | Disposition: A | Discharge status: Home / Self Care | Payer: Medicare Other | Source: Ambulatory Visit | Attending: Internal Medicine | Admitting: Internal Medicine

## 2020-04-03 DIAGNOSIS — E785 Hyperlipidemia, unspecified: Secondary | ICD-10-CM

## 2020-04-03 DIAGNOSIS — I700 Atherosclerosis of aorta: Secondary | ICD-10-CM | POA: Diagnosis not present

## 2020-04-23 ENCOUNTER — Ambulatory Visit: Payer: Medicare Other | Admitting: Certified Nurse Midwife

## 2020-05-28 DIAGNOSIS — D0461 Carcinoma in situ of skin of right upper limb, including shoulder: Secondary | ICD-10-CM | POA: Diagnosis not present

## 2020-05-28 DIAGNOSIS — Z85828 Personal history of other malignant neoplasm of skin: Secondary | ICD-10-CM | POA: Diagnosis not present

## 2020-05-28 DIAGNOSIS — L570 Actinic keratosis: Secondary | ICD-10-CM | POA: Diagnosis not present

## 2020-06-14 DIAGNOSIS — Z23 Encounter for immunization: Secondary | ICD-10-CM | POA: Diagnosis not present

## 2020-06-20 DIAGNOSIS — H5213 Myopia, bilateral: Secondary | ICD-10-CM | POA: Diagnosis not present

## 2020-06-20 DIAGNOSIS — H2513 Age-related nuclear cataract, bilateral: Secondary | ICD-10-CM | POA: Diagnosis not present

## 2020-06-20 DIAGNOSIS — H35372 Puckering of macula, left eye: Secondary | ICD-10-CM | POA: Diagnosis not present

## 2020-06-26 DIAGNOSIS — L718 Other rosacea: Secondary | ICD-10-CM | POA: Diagnosis not present

## 2020-06-26 DIAGNOSIS — D2272 Melanocytic nevi of left lower limb, including hip: Secondary | ICD-10-CM | POA: Diagnosis not present

## 2020-06-26 DIAGNOSIS — L814 Other melanin hyperpigmentation: Secondary | ICD-10-CM | POA: Diagnosis not present

## 2020-06-26 DIAGNOSIS — Z85828 Personal history of other malignant neoplasm of skin: Secondary | ICD-10-CM | POA: Diagnosis not present

## 2020-06-26 DIAGNOSIS — L821 Other seborrheic keratosis: Secondary | ICD-10-CM | POA: Diagnosis not present

## 2020-08-03 ENCOUNTER — Other Ambulatory Visit: Payer: Self-pay | Admitting: Internal Medicine

## 2020-08-03 DIAGNOSIS — Z1231 Encounter for screening mammogram for malignant neoplasm of breast: Secondary | ICD-10-CM

## 2020-08-24 NOTE — Progress Notes (Signed)
80 y.o. G72P0002 Married White or Caucasian female here for breast & pelvic.      Patient's last menstrual period was 03/01/2013.          Sexually active: Yes.    The current method of family planning is post menopausal status.    Exercising: Yes.    gym 2x weekly walking 2.5 miles x5 weekly  Smoker:  no  Health Maintenance: Pap:  04-13-18 neg HPV HR neg History of abnormal Pap:  no MMG:  08-26-2019 category d density birads 1:neg Colonoscopy:  2019 polyp f/u 36yrs BMD:   2020 TDaP:  2013 Gardasil:   n/a Covid-19: pfizer Hep C testing: none  Screening Labs: none    reports that she quit smoking about 53 years ago. Her smoking use included cigarettes. She started smoking about 62 years ago. She quit after 9.00 years of use. She has never used smokeless tobacco. She reports current alcohol use of about 4.0 standard drinks of alcohol per week. She reports that she does not use drugs.  Past Medical History:  Diagnosis Date  . Allergy   . Arthritis   . Cataract   . Colon polyp 6/09  . COPD (chronic obstructive pulmonary disease) ([facility])   . Endometrial polyp 12/07  . Hyperlipidemia   . IBS (irritable bowel syndrome)   . Lipoma 2000   right upper chest wall  . Migraine headache   . Osteopenia   . Skin cancer   . Skin cancer     Past Surgical History:  Procedure Laterality Date  . DILATATION & CURRETTAGE/HYSTEROSCOPY WITH RESECTOCOPE N/A 05/03/2013   Procedure: [place];  Surgeon: Lyman Speller, MD;  Location: [facility];  Service: Gynecology;  Laterality: N/A;  . MOLE REMOVAL    . TONSILLECTOMY AND ADENOIDECTOMY    . VAGINAL DELIVERY     x2     Current Outpatient Medications  Medication Sig Dispense Refill  . ANORO ELLIPTA 62.5-25 MCG/INH AEPB SMARTSIG:1 Unspecified Via Inhaler Daily    . Biotin 5000 MCG CAPS Take 5,000 mcg by mouth every evening.    . Cholecalciferol (VITAMIN D3 PO) Take by mouth.    . EVENING PRIMROSE OIL PO  Take 1,300 mg by mouth 2 (two) times daily.    Marland Kitchen ezetimibe (ZETIA) 10 MG tablet   10  . fish oil-omega-3 fatty acids 1000 MG capsule Take 1 g by mouth daily.    Marland Kitchen MAGNESIUM CITRATE PO Take 150 mg by mouth every evening.     . Multiple Vitamin (MULTIVITAMIN WITH MINERALS) TABS tablet Take 1 tablet by mouth daily.    Marland Kitchen OVER THE COUNTER MEDICATION Take 1-2 tablets by mouth 2 (two) times daily. Tart Cherry (vitamin) 2 in AM and 1 in PM    . SUMAtriptan (IMITREX) 50 MG tablet   3  . Triamcinolone Acetonide (NASACORT ALLERGY 24HR NA) Place into the nose.     No current facility-administered medications for this visit.    Family History  Problem Relation Age of Onset  . Lung cancer Father   . Heart disease Father   . Heart attack Mother   . Osteoarthritis Mother   . Heart disease Maternal Grandfather   . Heart disease Maternal Grandmother   . Cancer Sister     Review of Systems  Constitutional: Negative.   HENT: Negative.   Eyes: Negative.   Respiratory: Negative.   Cardiovascular: Negative.   Gastrointestinal: Negative.   Endocrine: Negative.   Genitourinary:  Negative.   Musculoskeletal: Negative.   Skin: Negative.   Allergic/Immunologic: Negative.   Neurological: Negative.   Hematological: Negative.   Psychiatric/Behavioral: Negative.   All other systems reviewed and are negative.   Exam:   BP 116/64   Ht 5\' 1"  (1.549 m)   Wt 111 lb 12.8 oz (50.7 kg)   LMP 03/01/2013   BMI 21.12 kg/m   Height: 5\' 1"  (154.9 cm)  General appearance: alert, cooperative and appears stated age, no acute distress  Breasts: normal appearance, no masses or tenderness Pt has large lypoma Right breast, soft, has previously been evaluated.   Pelvic: External genitalia:  no lesions              Urethra:  normal appearing urethra with no masses, tenderness or lesions              Bartholins and Skenes: normal                 Vagina: normal appearing vagina,atrophy appropriate for age, normal  appearing discharge, no lesions              Cervix: neg cervical motion tenderness, no visible lesions (unable to visualize posterior portion)             Bimanual Exam:   Uterus:  normal size, contour, position, consistency, mobility, non-tender              Adnexa: no mass, fullness, tenderness            Rectal: no mass     Eustace Pen, CMA Chaperone was present for exam.  A:  Well Woman with normal exam  P:   Pap : may discontinue, very low risk, if desired due 2024  Mammogram: pt states has scheduled and is soon  Labs: with PCP  Medications: no new, encouraged calcium 1200mg  and vit D [phone] iu  Dexa: discussed, pt to discuss with PCP, Dr. Joylene Draft  F/U 2 years

## 2020-08-27 ENCOUNTER — Ambulatory Visit (INDEPENDENT_AMBULATORY_CARE_PROVIDER_SITE_OTHER): Payer: Medicare Other | Admitting: Nurse Practitioner

## 2020-08-27 ENCOUNTER — Encounter: Payer: Self-pay | Admitting: Nurse Practitioner

## 2020-08-27 ENCOUNTER — Other Ambulatory Visit: Payer: Self-pay

## 2020-08-27 ENCOUNTER — Ambulatory Visit: Payer: Medicare Other

## 2020-08-27 VITALS — BP 116/64 | Ht 61.0 in | Wt 111.8 lb

## 2020-08-27 DIAGNOSIS — Z01419 Encounter for gynecological examination (general) (routine) without abnormal findings: Secondary | ICD-10-CM | POA: Diagnosis not present

## 2020-08-27 NOTE — Patient Instructions (Signed)
Health Maintenance After Age 80 After age 80, you are at a higher risk for certain long-term diseases and infections as well as injuries from falls. Falls are a major cause of broken bones and head injuries in people who are older than age 80. Getting regular preventive care can help to keep you healthy and well. Preventive care includes getting regular testing and making lifestyle changes as recommended by your health care provider. Talk with your health care provider about:  Which screenings and tests you should have. A screening is a test that checks for a disease when you have no symptoms.  A diet and exercise plan that is right for you. What should I know about screenings and tests to prevent falls? Screening and testing are the best ways to find a health problem early. Early diagnosis and treatment give you the best chance of managing medical conditions that are common after age 80. Certain conditions and lifestyle choices may make you more likely to have a fall. Your health care provider may recommend:  Regular vision checks. Poor vision and conditions such as cataracts can make you more likely to have a fall. If you wear glasses, make sure to get your prescription updated if your vision changes.  Medicine review. Work with your health care provider to regularly review all of the medicines you are taking, including over-the-counter medicines. Ask your health care provider about any side effects that may make you more likely to have a fall. Tell your health care provider if any medicines that you take make you feel dizzy or sleepy.  Osteoporosis screening. Osteoporosis is a condition that causes the bones to get weaker. This can make the bones weak and cause them to break more easily.  Blood pressure screening. Blood pressure changes and medicines to control blood pressure can make you feel dizzy.  Strength and balance checks. Your health care provider may recommend certain tests to check your  strength and balance while standing, walking, or changing positions.  Foot health exam. Foot pain and numbness, as well as not wearing proper footwear, can make you more likely to have a fall.  Depression screening. You may be more likely to have a fall if you have a fear of falling, feel emotionally low, or feel unable to do activities that you used to do.  Alcohol use screening. Using too much alcohol can affect your balance and may make you more likely to have a fall. What actions can I take to lower my risk of falls? General instructions  Talk with your health care provider about your risks for falling. Tell your health care provider if: ? You fall. Be sure to tell your health care provider about all falls, even ones that seem minor. ? You feel dizzy, sleepy, or off-balance.  Take over-the-counter and prescription medicines only as told by your health care provider. These include any supplements.  Eat a healthy diet and maintain a healthy weight. A healthy diet includes low-fat dairy products, low-fat (lean) meats, and fiber from whole grains, beans, and lots of fruits and vegetables. Home safety  Remove any tripping hazards, such as rugs, cords, and clutter.  Install safety equipment such as grab bars in bathrooms and safety rails on stairs.  Keep rooms and walkways well-lit. Activity  Follow a regular exercise program to stay fit. This will help you maintain your balance. Ask your health care provider what types of exercise are appropriate for you.  If you need a cane or walker,   use it as recommended by your health care provider.  Wear supportive shoes that have nonskid soles.   Lifestyle  Do not drink alcohol if your health care provider tells you not to drink.  If you drink alcohol, limit how much you have: ? 0-1 drink a day for women. ? 0-2 drinks a day for men.  Be aware of how much alcohol is in your drink. In the [place] one drink equals one typical bottle of beer (12  oz), one-half glass of wine (5 oz), or one shot of hard liquor (1 oz).  Do not use any products that contain nicotine or tobacco, such as cigarettes and e-cigarettes. If you need help quitting, ask your health care provider. Summary  Having a healthy lifestyle and getting preventive care can help to protect your health and wellness after age 80.  Screening and testing are the best way to find a health problem early and help you avoid having a fall. Early diagnosis and treatment give you the best chance for managing medical conditions that are more common for people who are older than age 80.  Falls are a major cause of broken bones and head injuries in people who are older than age 80. Take precautions to prevent a fall at home.  Work with your health care provider to learn what changes you can make to improve your health and wellness and to prevent falls. This information is not intended to replace advice given to you by your health care provider. Make sure you discuss any questions you have with your health care provider. Document Revised: 10/21/2018 Document Reviewed: 05/13/2017 Elsevier Patient Education  2021 Elsevier Inc.  

## 2020-09-11 DIAGNOSIS — L570 Actinic keratosis: Secondary | ICD-10-CM | POA: Diagnosis not present

## 2020-09-11 DIAGNOSIS — S80812A Abrasion, left lower leg, initial encounter: Secondary | ICD-10-CM | POA: Diagnosis not present

## 2020-09-11 DIAGNOSIS — Z85828 Personal history of other malignant neoplasm of skin: Secondary | ICD-10-CM | POA: Diagnosis not present

## 2020-09-14 ENCOUNTER — Ambulatory Visit
Admission: RE | Admit: 2020-09-14 | Discharge: 2020-09-14 | Disposition: A | Discharge status: Home / Self Care | Payer: Medicare Other | Source: Ambulatory Visit | Attending: Internal Medicine | Admitting: Internal Medicine

## 2020-09-14 ENCOUNTER — Other Ambulatory Visit: Payer: Self-pay

## 2020-09-14 DIAGNOSIS — Z1231 Encounter for screening mammogram for malignant neoplasm of breast: Secondary | ICD-10-CM | POA: Diagnosis not present

## 2020-09-18 NOTE — Progress Notes (Signed)
Please schedule diagnostic mammogram and possible ultrasound of Right breast for right breast asymmetry.

## 2020-09-19 ENCOUNTER — Other Ambulatory Visit: Payer: Self-pay | Admitting: Internal Medicine

## 2020-09-19 DIAGNOSIS — R928 Other abnormal and inconclusive findings on diagnostic imaging of breast: Secondary | ICD-10-CM

## 2020-10-01 DIAGNOSIS — Z85828 Personal history of other malignant neoplasm of skin: Secondary | ICD-10-CM | POA: Diagnosis not present

## 2020-10-01 DIAGNOSIS — L570 Actinic keratosis: Secondary | ICD-10-CM | POA: Diagnosis not present

## 2020-10-04 ENCOUNTER — Telehealth: Payer: Self-pay | Admitting: Internal Medicine

## 2020-10-04 DIAGNOSIS — M8589 Other specified disorders of bone density and structure, multiple sites: Secondary | ICD-10-CM | POA: Diagnosis not present

## 2020-10-04 NOTE — Telephone Encounter (Signed)
Ran into patient outside work by [employer]. She mentioned that I had taken care of her in clnic in 2019. Currently doing well. No active followup.     SIGNATURE    Dr. Brand Males, M.D., F.C.C.P,  Pulmonary and Critical Care Medicine Staff Physician, Steward Director - Interstitial Lung Disease  Program  P[facility] [place], [place], [address]  Pager: [phone], If no answer  OR between  19:00-7:00h: page [phone] Telephone (clinical office): [phone] Telephone (research): [phone]  9:18 AM 10/04/2020

## 2020-10-05 ENCOUNTER — Other Ambulatory Visit: Payer: Medicare Other

## 2020-10-09 ENCOUNTER — Other Ambulatory Visit: Payer: Self-pay

## 2020-10-09 ENCOUNTER — Ambulatory Visit (INDEPENDENT_AMBULATORY_CARE_PROVIDER_SITE_OTHER): Payer: Medicare Other | Admitting: Sports Medicine

## 2020-10-09 VITALS — BP 136/88 | Ht 61.0 in | Wt 110.0 lb

## 2020-10-09 DIAGNOSIS — R252 Cramp and spasm: Secondary | ICD-10-CM | POA: Insufficient documentation

## 2020-10-09 DIAGNOSIS — M545 Low back pain, unspecified: Secondary | ICD-10-CM | POA: Diagnosis not present

## 2020-10-09 NOTE — Assessment & Plan Note (Signed)
We will check LS spine films Pending results she may need to try low dose gabapentin at HS

## 2020-10-09 NOTE — Progress Notes (Signed)
CC; Bilateral morning cramping  Hx of scoliosis and some low back and neck pain Now has significant cramps in both legs This occurs with waking up Worse with stretching her legs No cramping with hot weather No hydration problems  ROS No sciatica Periodic low back pain Neck pain with too much computer work  PE Pleasant older lady - looks younger than age - NAD BP 136/88   Ht 5\' 1"  (1.549 m)   Wt 110 lb (49.9 kg)   LMP 03/01/2013   BMI 20.78 kg/m  No flowsheet data found.  Good ROM of neck Some limits with left rotation and bending  Good flexion and extension of back with no pain Loss of lumbar lordosis Mild scoliosis  Hamstring stretch/ resistance causes bilateral HS cramps Strength is good

## 2020-10-10 ENCOUNTER — Ambulatory Visit
Admission: RE | Admit: 2020-10-10 | Discharge: 2020-10-10 | Disposition: A | Discharge status: Home / Self Care | Payer: Medicare Other | Source: Ambulatory Visit | Attending: Sports Medicine | Admitting: Sports Medicine

## 2020-10-10 DIAGNOSIS — M545 Low back pain, unspecified: Secondary | ICD-10-CM

## 2020-10-17 DIAGNOSIS — L570 Actinic keratosis: Secondary | ICD-10-CM | POA: Diagnosis not present

## 2020-10-17 DIAGNOSIS — Z85828 Personal history of other malignant neoplasm of skin: Secondary | ICD-10-CM | POA: Diagnosis not present

## 2020-10-26 ENCOUNTER — Other Ambulatory Visit: Payer: Medicare Other

## 2020-11-06 ENCOUNTER — Other Ambulatory Visit: Payer: Medicare Other

## 2020-11-07 ENCOUNTER — Ambulatory Visit
Admission: RE | Admit: 2020-11-07 | Discharge: 2020-11-07 | Disposition: A | Discharge status: Home / Self Care | Payer: Medicare Other | Source: Ambulatory Visit | Attending: Internal Medicine | Admitting: Internal Medicine

## 2020-11-07 ENCOUNTER — Other Ambulatory Visit: Payer: Self-pay

## 2020-11-07 ENCOUNTER — Other Ambulatory Visit: Payer: Self-pay | Admitting: Internal Medicine

## 2020-11-07 DIAGNOSIS — R928 Other abnormal and inconclusive findings on diagnostic imaging of breast: Secondary | ICD-10-CM

## 2020-11-08 DIAGNOSIS — H2512 Age-related nuclear cataract, left eye: Secondary | ICD-10-CM | POA: Diagnosis not present

## 2020-11-08 DIAGNOSIS — H25812 Combined forms of age-related cataract, left eye: Secondary | ICD-10-CM | POA: Diagnosis not present

## 2020-11-16 DIAGNOSIS — H35352 Cystoid macular degeneration, left eye: Secondary | ICD-10-CM | POA: Diagnosis not present

## 2020-11-20 DIAGNOSIS — Z23 Encounter for immunization: Secondary | ICD-10-CM | POA: Diagnosis not present

## 2020-11-28 ENCOUNTER — Other Ambulatory Visit: Payer: Medicare Other

## 2020-12-04 DIAGNOSIS — H35371 Puckering of macula, right eye: Secondary | ICD-10-CM | POA: Diagnosis not present

## 2020-12-06 ENCOUNTER — Other Ambulatory Visit: Payer: Self-pay

## 2020-12-06 ENCOUNTER — Ambulatory Visit
Admission: RE | Admit: 2020-12-06 | Discharge: 2020-12-06 | Disposition: A | Discharge status: Home / Self Care | Payer: Medicare Other | Source: Ambulatory Visit | Attending: Internal Medicine | Admitting: Internal Medicine

## 2020-12-06 DIAGNOSIS — R928 Other abnormal and inconclusive findings on diagnostic imaging of breast: Secondary | ICD-10-CM

## 2020-12-06 DIAGNOSIS — N6489 Other specified disorders of breast: Secondary | ICD-10-CM | POA: Diagnosis not present

## 2020-12-11 DIAGNOSIS — L821 Other seborrheic keratosis: Secondary | ICD-10-CM | POA: Diagnosis not present

## 2020-12-11 DIAGNOSIS — L570 Actinic keratosis: Secondary | ICD-10-CM | POA: Diagnosis not present

## 2020-12-11 DIAGNOSIS — D692 Other nonthrombocytopenic purpura: Secondary | ICD-10-CM | POA: Diagnosis not present

## 2020-12-11 DIAGNOSIS — D2261 Melanocytic nevi of right upper limb, including shoulder: Secondary | ICD-10-CM | POA: Diagnosis not present

## 2020-12-11 DIAGNOSIS — Z85828 Personal history of other malignant neoplasm of skin: Secondary | ICD-10-CM | POA: Diagnosis not present

## 2021-01-01 DIAGNOSIS — H35372 Puckering of macula, left eye: Secondary | ICD-10-CM | POA: Diagnosis not present

## 2021-01-15 DIAGNOSIS — E785 Hyperlipidemia, unspecified: Secondary | ICD-10-CM | POA: Diagnosis not present

## 2021-01-15 DIAGNOSIS — R7301 Impaired fasting glucose: Secondary | ICD-10-CM | POA: Diagnosis not present

## 2021-01-22 DIAGNOSIS — H35372 Puckering of macula, left eye: Secondary | ICD-10-CM | POA: Diagnosis not present

## 2021-01-29 ENCOUNTER — Other Ambulatory Visit: Payer: Self-pay | Admitting: Endocrinology

## 2021-01-29 DIAGNOSIS — J449 Chronic obstructive pulmonary disease, unspecified: Secondary | ICD-10-CM | POA: Diagnosis not present

## 2021-01-29 DIAGNOSIS — R1031 Right lower quadrant pain: Secondary | ICD-10-CM | POA: Diagnosis not present

## 2021-01-29 DIAGNOSIS — Z0000 Encounter for general adult medical examination without abnormal findings: Secondary | ICD-10-CM | POA: Diagnosis not present

## 2021-01-29 DIAGNOSIS — E785 Hyperlipidemia, unspecified: Secondary | ICD-10-CM | POA: Diagnosis not present

## 2021-01-29 DIAGNOSIS — R011 Cardiac murmur, unspecified: Secondary | ICD-10-CM | POA: Diagnosis not present

## 2021-01-29 DIAGNOSIS — M8580 Other specified disorders of bone density and structure, unspecified site: Secondary | ICD-10-CM | POA: Diagnosis not present

## 2021-01-29 DIAGNOSIS — R7301 Impaired fasting glucose: Secondary | ICD-10-CM | POA: Diagnosis not present

## 2021-01-29 DIAGNOSIS — I700 Atherosclerosis of aorta: Secondary | ICD-10-CM | POA: Diagnosis not present

## 2021-01-29 DIAGNOSIS — R82998 Other abnormal findings in urine: Secondary | ICD-10-CM | POA: Diagnosis not present

## 2021-01-30 DIAGNOSIS — Z1212 Encounter for screening for malignant neoplasm of rectum: Secondary | ICD-10-CM | POA: Diagnosis not present

## 2021-02-11 ENCOUNTER — Other Ambulatory Visit: Payer: Medicare Other

## 2021-02-11 DIAGNOSIS — H11152 Pinguecula, left eye: Secondary | ICD-10-CM | POA: Diagnosis not present

## 2021-02-15 ENCOUNTER — Ambulatory Visit
Admission: RE | Admit: 2021-02-15 | Discharge: 2021-02-15 | Disposition: A | Discharge status: Home / Self Care | Payer: Medicare Other | Source: Ambulatory Visit | Attending: Endocrinology | Admitting: Endocrinology

## 2021-02-15 ENCOUNTER — Other Ambulatory Visit: Payer: Self-pay

## 2021-02-15 DIAGNOSIS — R1031 Right lower quadrant pain: Secondary | ICD-10-CM

## 2021-02-18 ENCOUNTER — Telehealth: Payer: Self-pay | Admitting: Internal Medicine

## 2021-02-18 NOTE — Telephone Encounter (Signed)
Please see note below and advise regarding scheduling. There are no app appts this week. There is an opening on your schedule 8/15 am. Please advise is 8/15 appt ok.

## 2021-02-18 NOTE — Telephone Encounter (Signed)
[name] with PCP office called requesting appt for pt asap. Pt had CT abd pelvis at [facility] on 02/15/21 (results available in Epic) that are concerning to Dr. Joylene Draft. Pls call pt directly to offer appt.

## 2021-02-19 NOTE — Telephone Encounter (Signed)
Pt scheduled to see Dr. Henrene Pastor 02/25/21'@8'$ :40am. Pt aware of appt.

## 2021-02-19 NOTE — Telephone Encounter (Signed)
Appointment with me August 15 is fine.  Thanks

## 2021-02-20 ENCOUNTER — Telehealth: Payer: Self-pay | Admitting: Internal Medicine

## 2021-02-20 NOTE — Telephone Encounter (Signed)
Spoke with Baker Janus at [facility], received the first half of these records, waiting on second half.

## 2021-02-21 DIAGNOSIS — H2511 Age-related nuclear cataract, right eye: Secondary | ICD-10-CM | POA: Diagnosis not present

## 2021-02-21 DIAGNOSIS — H25811 Combined forms of age-related cataract, right eye: Secondary | ICD-10-CM | POA: Diagnosis not present

## 2021-02-25 ENCOUNTER — Ambulatory Visit (INDEPENDENT_AMBULATORY_CARE_PROVIDER_SITE_OTHER): Payer: Medicare Other | Admitting: Internal Medicine

## 2021-02-25 ENCOUNTER — Encounter: Payer: Self-pay | Admitting: Internal Medicine

## 2021-02-25 VITALS — BP 130/70 | HR 68 | Ht 60.75 in | Wt 110.2 lb

## 2021-02-25 DIAGNOSIS — R935 Abnormal findings on diagnostic imaging of other abdominal regions, including retroperitoneum: Secondary | ICD-10-CM | POA: Diagnosis not present

## 2021-02-25 NOTE — Patient Instructions (Signed)
If you are age 80 or older, your body mass index should be between 23-30. Your Body mass index is 21 kg/m. If this is out of the aforementioned range listed, please consider follow up with your Primary Care Provider.  If you are age 58 or younger, your body mass index should be between 19-25. Your Body mass index is 21 kg/m. If this is out of the aformentioned range listed, please consider follow up with your Primary Care Provider.   __________________________________________________________  The [facility] providers would like to encourage you to use [facility] to communicate with providers for non-urgent requests or questions.  Due to long hold times on the telephone, sending your provider a message by [facility] may be a faster and more efficient way to get a response.  Please allow 48 business hours for a response.  Please remember that this is for non-urgent requests.   You have been scheduled for an endoscopy. Please follow written instructions given to you at your visit today. If you use inhalers (even only as needed), please bring them with you on the day of your procedure.

## 2021-02-25 NOTE — Progress Notes (Signed)
HISTORY OF PRESENT ILLNESS:  Cindy Valencia is a 80 y.o. female with past medical history as listed below who was sent today by her primary care provider regarding abnormal CT scan of the abdomen for which upper endoscopy was requested.  I last saw the patient January 25, 2018 regarding surveillance colonoscopy.  She was found to have diminutive adenomatous.  Exam was otherwise normal.  Follow-up in 5 years recommended.  She remains active and excellent health.  Current history is that of an abnormal right lower abdominal sensation to shower.  She notices 2 occasions.  Her sister had recently died from liposarcoma.  This concerned her.  She did she return provider.  Review of blood work from January 15, 2021 showed unremarkable comprehensive metabolic panel.  Normal CBC with hemoglobin 13.6.  Subsequent Hemoccult testing returned negative.  CT scan of the abdomen and pelvis without IV contrast (allergy) was obtained.  She was noted to have increased stool in the proximal colon.  No other acute abnormalities.  The concern of possible proximal gastric wall thickening (versus underdistention) was raised.  Upper GI imaging or endoscopy recommended for clarification.  Patient tells me that she has had no further abdominal discomfort.  Her GI review of systems is otherwise negative.  REVIEW OF SYSTEMS:  All non-GI ROS negative unless otherwise stated in the HPI except for sinus and allergy, headaches  Past Medical History:  Diagnosis Date   Allergy    Aortic atherosclerosis ([facility])    Arthritis    Cardiac murmur    Cataract    Colon polyp 12/2007   COPD (chronic obstructive pulmonary disease) ([facility])    Endometrial polyp 06/2006   Hyperlipidemia    IBS (irritable bowel syndrome)    Lipoma 2000   right upper chest wall   Migraine headache    Nutritional anemia    Osteopenia    Skin cancer    Skin cancer     Past Surgical History:  Procedure Laterality Date   DILATATION & CURRETTAGE/HYSTEROSCOPY WITH  RESECTOCOPE N/A 05/03/2013   Procedure: [place];  Surgeon: Lyman Speller, MD;  Location: [facility];  Service: Gynecology;  Laterality: N/A;   MOLE REMOVAL     TONSILLECTOMY AND ADENOIDECTOMY     VAGINAL DELIVERY     x2     Social History [name]  reports that she quit smoking about 53 years ago. Her smoking use included cigarettes. She started smoking about 62 years ago. She has never used smokeless tobacco. She reports current alcohol use of about 4.0 standard drinks per week. She reports that she does not use drugs.  family history includes Cancer in her sister; Heart attack in her mother; Heart disease in her father, maternal grandfather, and maternal grandmother; Lung cancer in her father; Osteoarthritis in her mother.  Allergies  Allergen Reactions   Contrast Media [Iodinated Diagnostic Agents] Anaphylaxis    And hives and throat closed   Codeine     REACTION: nausea   Epinephrine Palpitations    Increased heart rate       PHYSICAL EXAMINATION: Vital signs: BP 130/70 (BP Location: Left Arm, Patient Position: Sitting, Cuff Size: Normal)   Pulse 68 Comment: irregular  Ht 5' 0.75" (1.543 m) Comment: height measured without shoes  Wt 110 lb 4 oz (50 kg)   LMP 03/01/2013   BMI 21.00 kg/m   Constitutional: generally well-appearing, no acute distress Psychiatric: alert and oriented x3, cooperative Eyes: extraocular movements  intact, anicteric, conjunctiva pink Mouth: oral pharynx moist, no lesions Neck: supple no lymphadenopathy Cardiovascular: heart regular rate and rhythm, no murmur Lungs: clear to auscultation bilaterally Abdomen: soft, nontender, nondistended, no obvious ascites, no peritoneal signs, normal bowel sounds, no organomegaly Rectal: Omitted Extremities: no clubbing, cyanosis, or lower extremity edema bilaterally Skin: no lesions on visible extremities Neuro: No focal deficits.  Cranial nerves  intact  ASSESSMENT:  1.  Transient vague right lower quadrant discomfort.  Resolved 2.  Question of gastric thickening on CT scan versus underdistention.  I suspect the latter.  Regardless, this requires clarification 3.  History of colon polyps.  Surveillance up-to-date.  Last examination July 2019   PLAN:  1.  Upper endoscopy.The nature of the procedure, as well as the risks, benefits, and alternatives were carefully and thoroughly reviewed with the patient. Ample time for discussion and questions allowed. The patient understood, was satisfied, and agreed to proceed.  2.  Surveillance colonoscopy around July 2024 3.  Further recommendations, if any, after the endoscopy has been completed .

## 2021-02-26 ENCOUNTER — Ambulatory Visit (AMBULATORY_SURGERY_CENTER): Payer: Medicare Other | Admitting: Internal Medicine

## 2021-02-26 ENCOUNTER — Encounter: Payer: Self-pay | Admitting: Internal Medicine

## 2021-02-26 VITALS — BP 121/57 | HR 54 | Temp 96.9°F | Resp 14 | Ht 60.75 in | Wt 110.0 lb

## 2021-02-26 DIAGNOSIS — K449 Diaphragmatic hernia without obstruction or gangrene: Secondary | ICD-10-CM

## 2021-02-26 DIAGNOSIS — R935 Abnormal findings on diagnostic imaging of other abdominal regions, including retroperitoneum: Secondary | ICD-10-CM

## 2021-02-26 DIAGNOSIS — K222 Esophageal obstruction: Secondary | ICD-10-CM | POA: Diagnosis not present

## 2021-02-26 DIAGNOSIS — R933 Abnormal findings on diagnostic imaging of other parts of digestive tract: Secondary | ICD-10-CM | POA: Diagnosis not present

## 2021-02-26 MED ORDER — SODIUM CHLORIDE 0.9 % IV SOLN: 500.0000 mL | Freq: Once | INTRAVENOUS | Status: DC

## 2021-02-26 NOTE — Op Note (Signed)
[facility] Patient Name: Cindy Valencia Procedure Date: 02/26/2021 3:09 PM MRN: [id number] Endoscopist: Docia Chuck. Henrene Pastor , MD Age: 80 Referring MD:  Date of Birth: 1941-04-16 Gender: Female Account #: [id number] Procedure:                Upper GI endoscopy Indications:              Abnormal CT of the GI tract Medicines:                Monitored Anesthesia Care Procedure:                Pre-Anesthesia Assessment:                           - Prior to the procedure, a History and Physical                            was performed, and patient medications and                            allergies were reviewed. The patient's tolerance of                            previous anesthesia was also reviewed. The risks                            and benefits of the procedure and the sedation                            options and risks were discussed with the patient.                            All questions were answered, and informed consent                            was obtained. Prior Anticoagulants: The patient has                            taken no previous anticoagulant or antiplatelet                            agents. ASA Grade Assessment: I - A normal, healthy                            patient. After reviewing the risks and benefits,                            the patient was deemed in satisfactory condition to                            undergo the procedure.                           After obtaining informed consent, the endoscope was  passed under direct vision. Throughout the                            procedure, the patient's blood pressure, pulse, and                            oxygen saturations were monitored continuously. The                            GIF [id number] was introduced through the                            mouth, and advanced to the second part of duodenum.                            The upper GI endoscopy was accomplished  without                            difficulty. The patient tolerated the procedure                            well. Scope In: Scope Out: Findings:                 The esophagus revealed a large caliber distal ring                            without associated inflammation.                           The stomach was normal save a small sliding hiatal                            hernia.                           The examined duodenum was normal.                           The cardia and gastric fundus were normal on                            retroflexion. Complications:            No immediate complications. Estimated Blood Loss:     Estimated blood loss: none. Impression:               1. Incidental esophageal ring                           2. Small hiatal hernia                           3. Otherwise normal EGD. Recommendation:           - Patient has a contact number available for  emergencies. The signs and symptoms of potential                            delayed complications were discussed with the                            patient. Return to normal activities tomorrow.                            Written discharge instructions were provided to the                            patient.                           - Resume previous diet.                           - Continue present medications. Docia Chuck. Henrene Pastor, MD 02/26/2021 3:27:00 PM This report has been signed electronically.

## 2021-02-26 NOTE — Progress Notes (Signed)
Pt spit out bite block herself

## 2021-02-26 NOTE — Progress Notes (Signed)
VS - DT Cell phone off per pt.  Pt's states no medical or surgical changes since previsit or office visit.

## 2021-02-26 NOTE — Progress Notes (Signed)
Report to PACU, RN, vss, BBS= Clear.  

## 2021-02-26 NOTE — Progress Notes (Signed)
GI PREPROCEDURE NOTE  The patient presents to the endoscopy center today for upper endoscopy to clarify abnormal CT scan finding.  She was fully evaluated in the office yesterday.  Please see that complete history and physical examination.  No interval changes.  Docia Chuck. Geri Seminole., M.D. [facility] Division of Gastroenterology

## 2021-02-26 NOTE — Patient Instructions (Signed)
YOU HAD AN ENDOSCOPIC PROCEDURE TODAY AT [facility]:   Refer to the procedure report that was given to you for any specific questions about what was found during the examination.  If the procedure report does not answer your questions, please call your gastroenterologist to clarify.  If you requested that your care partner not be given the details of your procedure findings, then the procedure report has been included in a sealed envelope for you to review at your convenience later.  YOU SHOULD EXPECT: Some feelings of bloating in the abdomen. Passage of more gas than usual.  Walking can help get rid of the air that was put into your GI tract during the procedure and reduce the bloating.  Please Note:  You might notice some irritation and congestion in your nose or some drainage.  This is from the oxygen used during your procedure.  There is no need for concern and it should clear up in a day or so.  SYMPTOMS TO REPORT IMMEDIATELY:  Following upper endoscopy (EGD)  Vomiting of blood or coffee ground material  New chest pain or pain under the shoulder blades  Painful or persistently difficult swallowing  New shortness of breath  Fever of 100F or higher  Black, tarry-looking stools  For urgent or emergent issues, a gastroenterologist can be reached at any hour by calling [phone]. Do not use MyChart messaging for urgent concerns.    DIET:  We do recommend a small meal at first, but then you may proceed to your regular diet.  Drink plenty of fluids but you should avoid alcoholic beverages for 24 hours.  ACTIVITY:  You should plan to take it easy for the rest of today and you should NOT DRIVE or use heavy machinery until tomorrow (because of the sedation medicines used during the test).    FOLLOW UP: Our staff will call the number listed on your records 48-72 hours following your procedure to check on you and address any questions or concerns that you may have regarding  the information given to you following your procedure. If we do not reach you, we will leave a message.  We will attempt to reach you two times.  During this call, we will ask if you have developed any symptoms of COVID 19. If you develop any symptoms (ie: fever, flu-like symptoms, shortness of breath, cough etc.) before then, please call [phone].  If you test positive for Covid 19 in the 2 weeks post procedure, please call and report this information to [place].    SIGNATURES/CONFIDENTIALITY: You and/or your care partner have signed paperwork which will be entered into your electronic medical record.  These signatures attest to the fact that that the information above on your After Visit Summary has been reviewed and is understood.  Full responsibility of the confidentiality of this discharge information lies with you and/or your care-partner.

## 2021-02-28 ENCOUNTER — Telehealth: Payer: Self-pay

## 2021-02-28 NOTE — Telephone Encounter (Signed)
  Follow up Call-  Call back number 02/26/2021  Post procedure Call Back phone  # [phone]  Permission to leave phone message Yes  Some recent data might be hidden     Patient questions:  Do you have a fever, pain , or abdominal swelling? No. Pain Score  0 *  Have you tolerated food without any problems? Yes.    Have you been able to return to your normal activities? Yes.    Do you have any questions about your discharge instructions: Diet   No. Medications  No. Follow up visit  No.  Do you have questions or concerns about your Care? No.  Actions: * If pain score is 4 or above: No action needed, pain <4.  Have you developed a fever since your procedure? no  2.   Have you had an respiratory symptoms (SOB or cough) since your procedure? no  3.   Have you tested positive for COVID 19 since your procedure no  4.   Have you had any family members/close contacts diagnosed with the COVID 19 since your procedure?  no   If yes to any of these questions please route to Joylene John, RN and Joella Prince, RN

## 2021-04-02 DIAGNOSIS — H35372 Puckering of macula, left eye: Secondary | ICD-10-CM | POA: Diagnosis not present

## 2021-04-23 DIAGNOSIS — M9906 Segmental and somatic dysfunction of lower extremity: Secondary | ICD-10-CM | POA: Diagnosis not present

## 2021-04-23 DIAGNOSIS — M9903 Segmental and somatic dysfunction of lumbar region: Secondary | ICD-10-CM | POA: Diagnosis not present

## 2021-04-23 DIAGNOSIS — R252 Cramp and spasm: Secondary | ICD-10-CM | POA: Diagnosis not present

## 2021-04-23 DIAGNOSIS — M9904 Segmental and somatic dysfunction of sacral region: Secondary | ICD-10-CM | POA: Diagnosis not present

## 2021-04-23 DIAGNOSIS — M9905 Segmental and somatic dysfunction of pelvic region: Secondary | ICD-10-CM | POA: Diagnosis not present

## 2021-04-23 DIAGNOSIS — M47816 Spondylosis without myelopathy or radiculopathy, lumbar region: Secondary | ICD-10-CM | POA: Diagnosis not present

## 2021-04-23 DIAGNOSIS — M79672 Pain in left foot: Secondary | ICD-10-CM | POA: Diagnosis not present

## 2021-04-30 DIAGNOSIS — H35373 Puckering of macula, bilateral: Secondary | ICD-10-CM | POA: Diagnosis not present

## 2021-04-30 DIAGNOSIS — H0015 Chalazion left lower eyelid: Secondary | ICD-10-CM | POA: Diagnosis not present

## 2021-04-30 DIAGNOSIS — H35351 Cystoid macular degeneration, right eye: Secondary | ICD-10-CM | POA: Diagnosis not present

## 2021-05-04 DIAGNOSIS — Z23 Encounter for immunization: Secondary | ICD-10-CM | POA: Diagnosis not present

## 2021-05-27 DIAGNOSIS — Z23 Encounter for immunization: Secondary | ICD-10-CM | POA: Diagnosis not present

## 2021-05-28 DIAGNOSIS — M79671 Pain in right foot: Secondary | ICD-10-CM | POA: Diagnosis not present

## 2021-05-28 DIAGNOSIS — M79672 Pain in left foot: Secondary | ICD-10-CM | POA: Diagnosis not present

## 2021-05-28 DIAGNOSIS — H0100B Unspecified blepharitis left eye, upper and lower eyelids: Secondary | ICD-10-CM | POA: Diagnosis not present

## 2021-05-28 DIAGNOSIS — M2011 Hallux valgus (acquired), right foot: Secondary | ICD-10-CM | POA: Diagnosis not present

## 2021-05-28 DIAGNOSIS — H0015 Chalazion left lower eyelid: Secondary | ICD-10-CM | POA: Diagnosis not present

## 2021-05-28 DIAGNOSIS — H0100A Unspecified blepharitis right eye, upper and lower eyelids: Secondary | ICD-10-CM | POA: Diagnosis not present

## 2021-05-28 DIAGNOSIS — M9906 Segmental and somatic dysfunction of lower extremity: Secondary | ICD-10-CM | POA: Diagnosis not present

## 2021-06-18 DIAGNOSIS — Z85828 Personal history of other malignant neoplasm of skin: Secondary | ICD-10-CM | POA: Diagnosis not present

## 2021-06-18 DIAGNOSIS — L821 Other seborrheic keratosis: Secondary | ICD-10-CM | POA: Diagnosis not present

## 2021-06-26 DIAGNOSIS — M79672 Pain in left foot: Secondary | ICD-10-CM | POA: Diagnosis not present

## 2021-07-22 DIAGNOSIS — M2011 Hallux valgus (acquired), right foot: Secondary | ICD-10-CM | POA: Diagnosis not present

## 2021-07-22 DIAGNOSIS — M79672 Pain in left foot: Secondary | ICD-10-CM | POA: Diagnosis not present

## 2021-07-22 DIAGNOSIS — M79671 Pain in right foot: Secondary | ICD-10-CM | POA: Diagnosis not present

## 2021-07-22 DIAGNOSIS — R252 Cramp and spasm: Secondary | ICD-10-CM | POA: Diagnosis not present

## 2021-07-24 DIAGNOSIS — H6123 Impacted cerumen, bilateral: Secondary | ICD-10-CM | POA: Diagnosis not present

## 2021-07-24 DIAGNOSIS — R040 Epistaxis: Secondary | ICD-10-CM | POA: Diagnosis not present

## 2021-07-26 DIAGNOSIS — D692 Other nonthrombocytopenic purpura: Secondary | ICD-10-CM | POA: Diagnosis not present

## 2021-07-26 DIAGNOSIS — D225 Melanocytic nevi of trunk: Secondary | ICD-10-CM | POA: Diagnosis not present

## 2021-07-26 DIAGNOSIS — Z85828 Personal history of other malignant neoplasm of skin: Secondary | ICD-10-CM | POA: Diagnosis not present

## 2021-07-26 DIAGNOSIS — L821 Other seborrheic keratosis: Secondary | ICD-10-CM | POA: Diagnosis not present

## 2021-07-26 DIAGNOSIS — L570 Actinic keratosis: Secondary | ICD-10-CM | POA: Diagnosis not present

## 2021-08-14 ENCOUNTER — Other Ambulatory Visit: Payer: Self-pay | Admitting: Internal Medicine

## 2021-08-14 DIAGNOSIS — Z1231 Encounter for screening mammogram for malignant neoplasm of breast: Secondary | ICD-10-CM

## 2021-08-30 DIAGNOSIS — M79672 Pain in left foot: Secondary | ICD-10-CM | POA: Diagnosis not present

## 2021-08-30 DIAGNOSIS — M9906 Segmental and somatic dysfunction of lower extremity: Secondary | ICD-10-CM | POA: Diagnosis not present

## 2021-09-06 DIAGNOSIS — H10412 Chronic giant papillary conjunctivitis, left eye: Secondary | ICD-10-CM | POA: Diagnosis not present

## 2021-09-06 DIAGNOSIS — H04122 Dry eye syndrome of left lacrimal gland: Secondary | ICD-10-CM | POA: Diagnosis not present

## 2021-09-06 DIAGNOSIS — H35373 Puckering of macula, bilateral: Secondary | ICD-10-CM | POA: Diagnosis not present

## 2021-09-16 ENCOUNTER — Inpatient Hospital Stay: Admission: RE | Admit: 2021-09-16 | Payer: Medicare Other | Source: Ambulatory Visit

## 2021-09-19 DIAGNOSIS — R040 Epistaxis: Secondary | ICD-10-CM | POA: Diagnosis not present

## 2021-09-25 ENCOUNTER — Ambulatory Visit: Payer: Medicare Other | Admitting: Podiatry

## 2021-10-15 DIAGNOSIS — R0981 Nasal congestion: Secondary | ICD-10-CM | POA: Diagnosis not present

## 2021-10-15 DIAGNOSIS — M5442 Lumbago with sciatica, left side: Secondary | ICD-10-CM | POA: Diagnosis not present

## 2021-10-15 DIAGNOSIS — Z1152 Encounter for screening for COVID-19: Secondary | ICD-10-CM | POA: Diagnosis not present

## 2021-10-15 DIAGNOSIS — J0100 Acute maxillary sinusitis, unspecified: Secondary | ICD-10-CM | POA: Diagnosis not present

## 2021-10-15 DIAGNOSIS — R051 Acute cough: Secondary | ICD-10-CM | POA: Diagnosis not present

## 2021-10-15 DIAGNOSIS — R5383 Other fatigue: Secondary | ICD-10-CM | POA: Diagnosis not present

## 2021-10-16 DIAGNOSIS — L570 Actinic keratosis: Secondary | ICD-10-CM | POA: Diagnosis not present

## 2021-10-16 DIAGNOSIS — D225 Melanocytic nevi of trunk: Secondary | ICD-10-CM | POA: Diagnosis not present

## 2021-10-16 DIAGNOSIS — Z85828 Personal history of other malignant neoplasm of skin: Secondary | ICD-10-CM | POA: Diagnosis not present

## 2021-10-16 DIAGNOSIS — D485 Neoplasm of uncertain behavior of skin: Secondary | ICD-10-CM | POA: Diagnosis not present

## 2021-10-21 DIAGNOSIS — H3412 Central retinal artery occlusion, left eye: Secondary | ICD-10-CM | POA: Diagnosis not present

## 2021-10-21 DIAGNOSIS — H43392 Other vitreous opacities, left eye: Secondary | ICD-10-CM | POA: Diagnosis not present

## 2021-10-21 DIAGNOSIS — H43813 Vitreous degeneration, bilateral: Secondary | ICD-10-CM | POA: Diagnosis not present

## 2021-10-21 DIAGNOSIS — H35372 Puckering of macula, left eye: Secondary | ICD-10-CM | POA: Diagnosis not present

## 2021-11-12 ENCOUNTER — Ambulatory Visit
Admission: RE | Admit: 2021-11-12 | Discharge: 2021-11-12 | Disposition: A | Discharge status: Home / Self Care | Payer: Medicare Other | Source: Ambulatory Visit | Attending: Internal Medicine | Admitting: Internal Medicine

## 2021-11-12 DIAGNOSIS — Z1231 Encounter for screening mammogram for malignant neoplasm of breast: Secondary | ICD-10-CM | POA: Diagnosis not present

## 2021-11-15 ENCOUNTER — Ambulatory Visit (INDEPENDENT_AMBULATORY_CARE_PROVIDER_SITE_OTHER): Payer: Medicare Other | Admitting: Podiatry

## 2021-11-15 DIAGNOSIS — M2042 Other hammer toe(s) (acquired), left foot: Secondary | ICD-10-CM

## 2021-11-15 NOTE — Progress Notes (Signed)
?Subjective:  ?Patient ID: Cindy Valencia, female    DOB: [birth year],  MRN: [id number] ? ?Chief Complaint  ?Patient presents with  ? Toe Pain  ? ? ?81 y.o. female presents with the above complaint.  Patient presents with complaint of left fifth digit pressure spot.  She states that there is something causing her pain.  She wanted get evaluated make sure there is nothing crazy going on.  She does a lot on her feet.  She denies seeing anyone else prior to seeing me.  She denies any other acute complaints.  The pain is very mild in nature.  She wears on cloud shoes.  ? ? ?Review of Systems: Negative except as noted in the HPI. Denies N/V/F/Ch. ? ?Past Medical History:  ?Diagnosis Date  ? Allergy   ? Aortic atherosclerosis ([place])   ? Arthritis   ? Cardiac murmur   ? Cataract   ? Colon polyp 12/2007  ? COPD (chronic obstructive pulmonary disease) ([place])   ? Endometrial polyp 06/2006  ? Hyperlipidemia   ? IBS (irritable bowel syndrome)   ? Lipoma 2000  ? right upper chest wall  ? Migraine headache   ? Nutritional anemia   ? Osteopenia   ? Skin cancer   ? Skin cancer   ? ? ?Current Outpatient Medications:  ?  ANORO ELLIPTA 62.5-25 MCG/INH AEPB, SMARTSIG:1 Unspecified Via Inhaler Daily, Disp: , Rfl:  ?  Biotin 5000 MCG CAPS, Take 5,000 mcg by mouth every evening., Disp: , Rfl:  ?  Cholecalciferol (VITAMIN D3 PO), Take by mouth., Disp: , Rfl:  ?  Difluprednate 0.05 % EMUL, Place 1 drop into the left eye 4 (four) times daily., Disp: , Rfl:  ?  EVENING PRIMROSE OIL PO, Take 1,300 mg by mouth 2 (two) times daily., Disp: , Rfl:  ?  ezetimibe (ZETIA) 10 MG tablet, Take 1 tablet by mouth daily., Disp: , Rfl:  ?  fish oil-omega-3 fatty acids 1000 MG capsule, Take 1 g by mouth daily., Disp: , Rfl:  ?  MAGNESIUM CITRATE PO, Take 150 mg by mouth every evening. , Disp: , Rfl:  ?  moxifloxacin (VIGAMOX) 0.5 % ophthalmic solution, Place into the right eye., Disp: , Rfl:  ?  Multiple Vitamin (MULTIVITAMIN WITH MINERALS) TABS tablet, Take  1 tablet by mouth daily., Disp: , Rfl:  ?  mupirocin ointment (BACTROBAN) 2 %, APPLY THREE TIMES DAILY TO AFFECTED AREA, Disp: , Rfl:  ?  OVER THE COUNTER MEDICATION, Take 1-2 tablets by mouth 2 (two) times daily. Tart Cherry (vitamin) 2 in AM and 1 in PM, Disp: , Rfl:  ?  prednisoLONE acetate (PRED FORTE) 1 % ophthalmic suspension, Place 1 drop into the right eye 4 (four) times daily., Disp: , Rfl:  ?  SUMAtriptan (IMITREX) 50 MG tablet, , Disp: , Rfl: 3 ?  Triamcinolone Acetonide (NASACORT ALLERGY 24HR NA), Place into the nose., Disp: , Rfl:  ?  zolpidem (AMBIEN) 5 MG tablet, 1 tablet at bedtime as needed for insomnia, Disp: , Rfl:  ? ?Social History  ? ?Tobacco Use  ?Smoking Status Former  ? Years: 9.00  ? Types: Cigarettes  ? Start date: 63  ? Quit date: 1969  ? Years since quitting: 54.3  ?Smokeless Tobacco Never  ?Tobacco Comments  ? pt smoked 3-4 cigarettes when smoked  ? ? ?Allergies  ?Allergen Reactions  ? Contrast Media [Iodinated Contrast Media] Anaphylaxis  ?  And hives and throat closed  ? Codeine   ?  REACTION: nausea  ? Epinephrine Palpitations  ?  Increased heart rate  ? ?Objective:  ?There were no vitals filed for this visit. ?There is no height or weight on file to calculate BMI. ?Constitutional Well developed. ?Well nourished.  ?Vascular Dorsalis pedis pulses palpable bilaterally. ?Posterior tibial pulses palpable bilaterally. ?Capillary refill normal to all digits.  ?No cyanosis or clubbing noted. ?Pedal hair growth normal.  ?Neurologic Normal speech. ?Oriented to person, place, and time. ?Epicritic sensation to light touch grossly present bilaterally.  ?Dermatologic Left fifth digit adductovarus hammertoe deformity noted.  Abrasion/pressure sore noted from external shoes to the remaining left outside part.  No breakdown of skin noted no clinical signs of infection noted.  Abrasion noted.  ?Orthopedic: Normal joint ROM without pain or crepitus bilaterally. ?No visible deformities. ?No bony  tenderness.  ? ?Radiographs: None ?Assessment:  ? ?1. Hammertoe of left foot   ? ?Plan:  ?Patient was evaluated and treated and all questions answered. ? ?Left fifth digit abrasion from underlying adductovarus hammertoe deformity ?-All questions and concerns were discussed with the patient in extensive detail.  I discussed shoe gear modification with her in extensive detail. ?-No pads were dispensed to give her relief.  If it does not get better we will discuss surgical options during next visit.  She states understanding. ? ?No follow-ups on file. ?

## 2021-11-18 DIAGNOSIS — H35372 Puckering of macula, left eye: Secondary | ICD-10-CM | POA: Diagnosis not present

## 2021-11-18 DIAGNOSIS — H35352 Cystoid macular degeneration, left eye: Secondary | ICD-10-CM | POA: Diagnosis not present

## 2021-11-18 DIAGNOSIS — H349 Unspecified retinal vascular occlusion: Secondary | ICD-10-CM | POA: Diagnosis not present

## 2021-11-27 DIAGNOSIS — Z85828 Personal history of other malignant neoplasm of skin: Secondary | ICD-10-CM | POA: Diagnosis not present

## 2021-11-27 DIAGNOSIS — D225 Melanocytic nevi of trunk: Secondary | ICD-10-CM | POA: Diagnosis not present

## 2021-11-27 DIAGNOSIS — D485 Neoplasm of uncertain behavior of skin: Secondary | ICD-10-CM | POA: Diagnosis not present

## 2021-11-28 DIAGNOSIS — Z961 Presence of intraocular lens: Secondary | ICD-10-CM | POA: Diagnosis not present

## 2021-11-28 DIAGNOSIS — H34812 Central retinal vein occlusion, left eye, with macular edema: Secondary | ICD-10-CM | POA: Diagnosis not present

## 2021-12-26 DIAGNOSIS — H34812 Central retinal vein occlusion, left eye, with macular edema: Secondary | ICD-10-CM | POA: Diagnosis not present

## 2021-12-26 DIAGNOSIS — Z961 Presence of intraocular lens: Secondary | ICD-10-CM | POA: Diagnosis not present

## 2022-01-13 DIAGNOSIS — H6123 Impacted cerumen, bilateral: Secondary | ICD-10-CM | POA: Diagnosis not present

## 2022-01-30 DIAGNOSIS — Z961 Presence of intraocular lens: Secondary | ICD-10-CM | POA: Diagnosis not present

## 2022-01-30 DIAGNOSIS — H34812 Central retinal vein occlusion, left eye, with macular edema: Secondary | ICD-10-CM | POA: Diagnosis not present

## 2022-02-27 DIAGNOSIS — H34812 Central retinal vein occlusion, left eye, with macular edema: Secondary | ICD-10-CM | POA: Diagnosis not present

## 2022-02-27 DIAGNOSIS — Z961 Presence of intraocular lens: Secondary | ICD-10-CM | POA: Diagnosis not present

## 2022-04-02 DIAGNOSIS — Z961 Presence of intraocular lens: Secondary | ICD-10-CM | POA: Diagnosis not present

## 2022-04-02 DIAGNOSIS — H524 Presbyopia: Secondary | ICD-10-CM | POA: Diagnosis not present

## 2022-04-03 DIAGNOSIS — H34812 Central retinal vein occlusion, left eye, with macular edema: Secondary | ICD-10-CM | POA: Diagnosis not present

## 2022-04-03 DIAGNOSIS — Z961 Presence of intraocular lens: Secondary | ICD-10-CM | POA: Diagnosis not present

## 2022-04-30 DIAGNOSIS — Z23 Encounter for immunization: Secondary | ICD-10-CM | POA: Diagnosis not present

## 2022-05-01 DIAGNOSIS — Z961 Presence of intraocular lens: Secondary | ICD-10-CM | POA: Diagnosis not present

## 2022-05-01 DIAGNOSIS — H3589 Other specified retinal disorders: Secondary | ICD-10-CM | POA: Diagnosis not present

## 2022-05-01 DIAGNOSIS — H34812 Central retinal vein occlusion, left eye, with macular edema: Secondary | ICD-10-CM | POA: Diagnosis not present

## 2022-05-12 DIAGNOSIS — L570 Actinic keratosis: Secondary | ICD-10-CM | POA: Diagnosis not present

## 2022-05-12 DIAGNOSIS — Z85828 Personal history of other malignant neoplasm of skin: Secondary | ICD-10-CM | POA: Diagnosis not present

## 2022-05-12 DIAGNOSIS — D692 Other nonthrombocytopenic purpura: Secondary | ICD-10-CM | POA: Diagnosis not present

## 2022-05-13 IMAGING — MG MM DIGITAL SCREENING BILAT W/ TOMO AND CAD
8 series · 9 of 24 positions shown · non-contrast
Comparison: Previous exam(s).

CLINICAL DATA: Screening.

EXAM:
DIGITAL SCREENING BILATERAL MAMMOGRAM WITH TOMOSYNTHESIS AND CAD
TECHNIQUE: Bilateral screening digital craniocaudal and mediolateral oblique
mammograms were obtained. Bilateral screening digital breast
tomosynthesis was performed. The images were evaluated with
computer-aided detection.

[L MLO synth-2D]
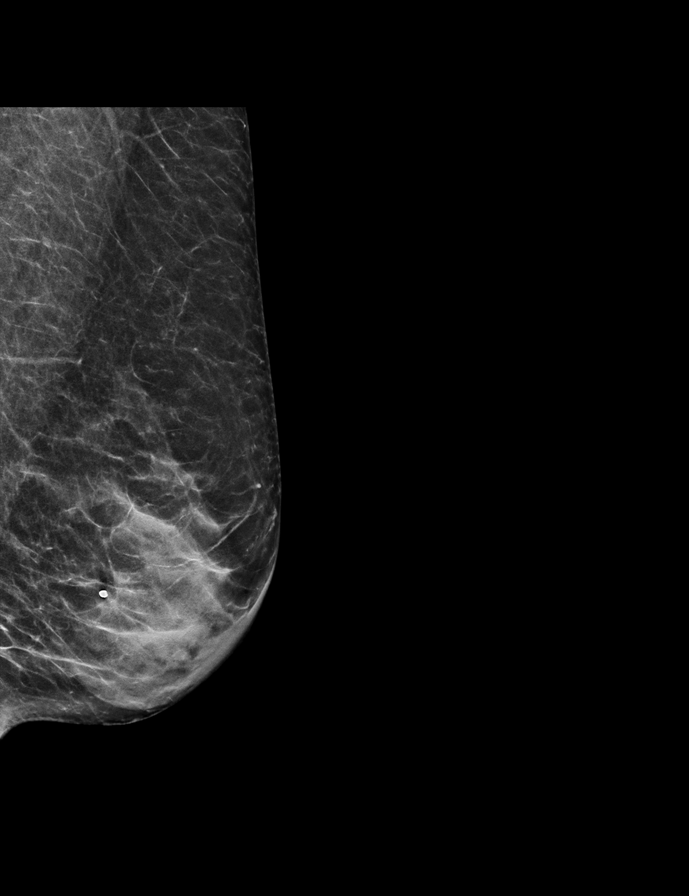

[R MLO synth-2D]
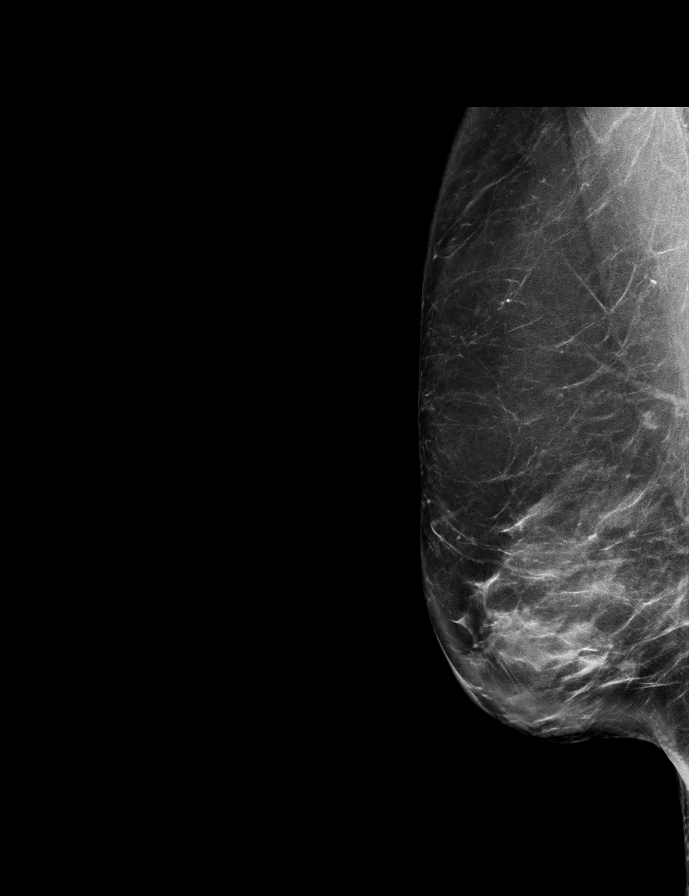

[L CC synth-2D]
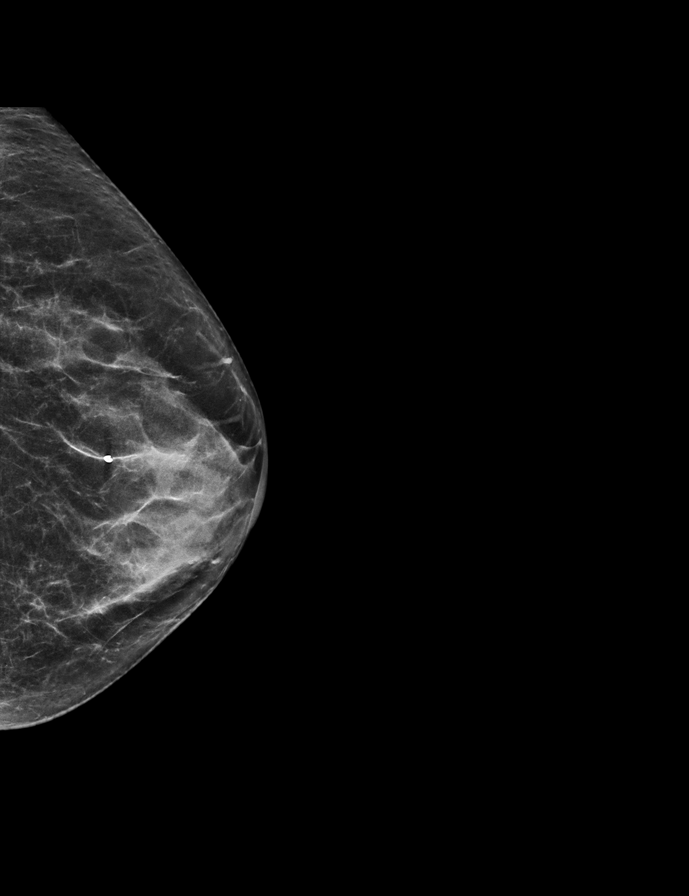

[R CC synth-2D]
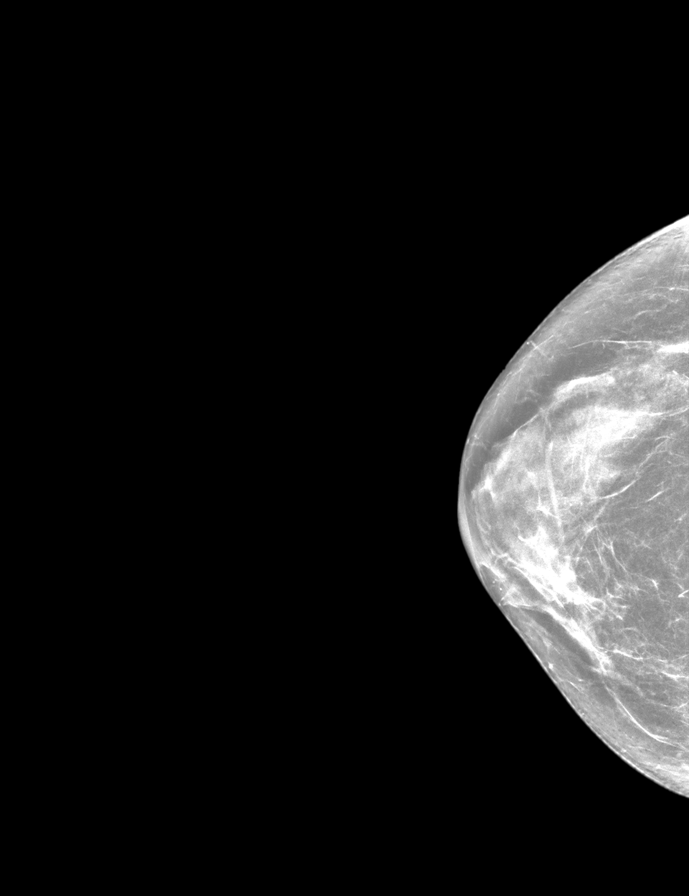

[R CC tomo · 2 of 54 frames shown]
[frame 18/54]
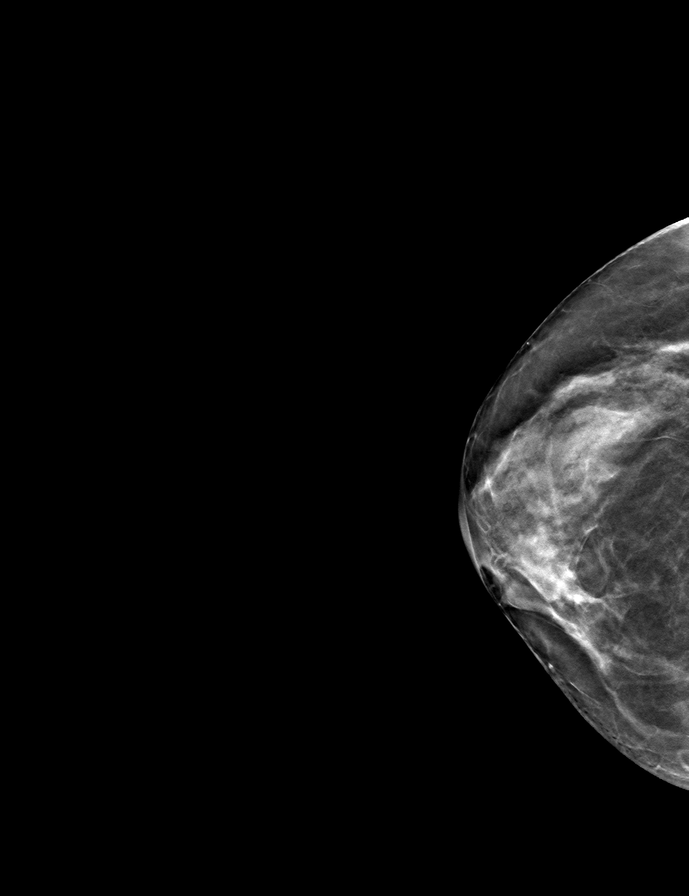
[frame 27/54]
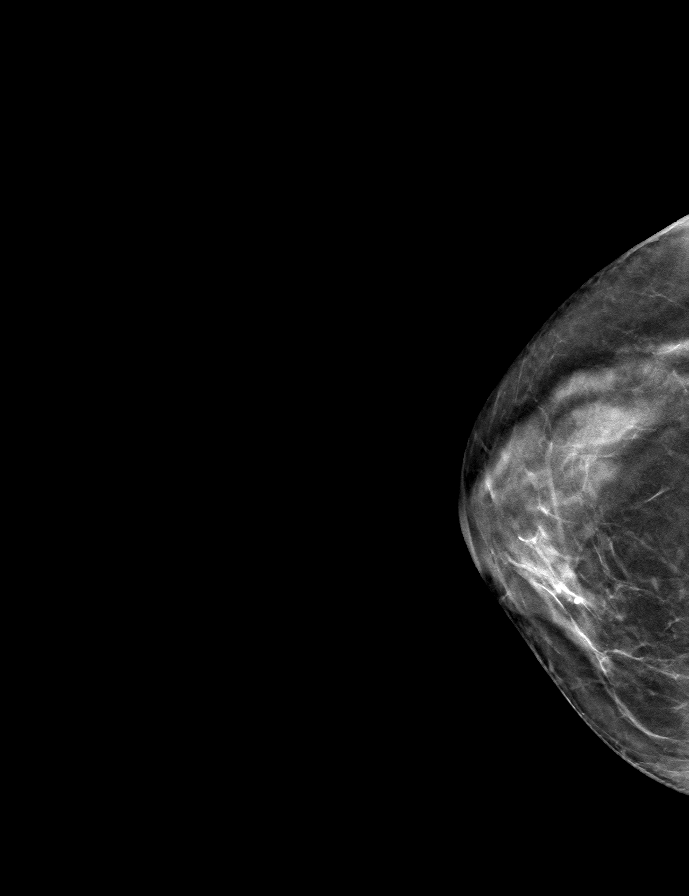

[R MLO tomo · tomo slice 36/71.0]
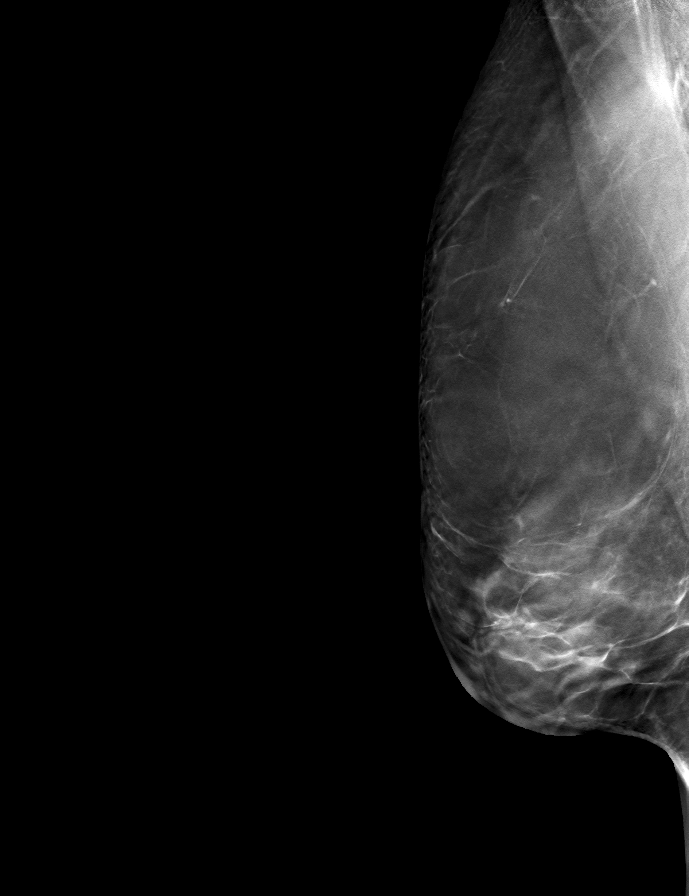

[L MLO tomo · tomo slice 26/51.0]
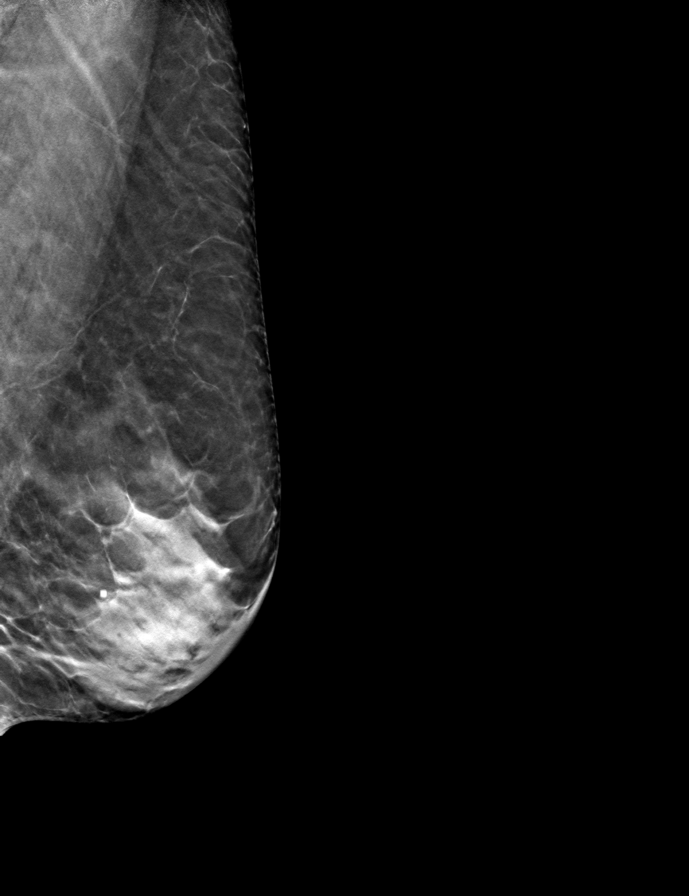

[L CC tomo · tomo slice 27/54.0]
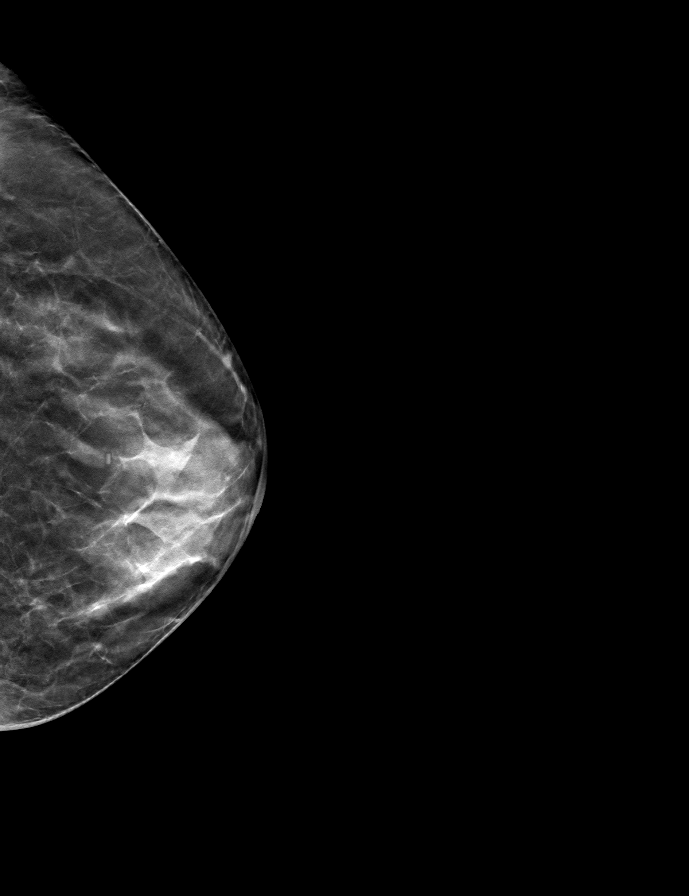

[9 of 24 positions shown; findings below may reference images not displayed]

ACR Breast Density Category c: The breast tissue is heterogeneously
dense, which may obscure small masses.
FINDINGS: There are no findings suspicious for malignancy.
IMPRESSION: No mammographic evidence of malignancy. A result letter of this
screening mammogram will be mailed directly to the patient.

RECOMMENDATION:
Screening mammogram in one year. (Code:Q3-W-BC3)

BI-RADS CATEGORY  1: Negative.

## 2022-06-02 DIAGNOSIS — Z23 Encounter for immunization: Secondary | ICD-10-CM | POA: Diagnosis not present

## 2022-06-12 DIAGNOSIS — Z961 Presence of intraocular lens: Secondary | ICD-10-CM | POA: Diagnosis not present

## 2022-06-12 DIAGNOSIS — H34812 Central retinal vein occlusion, left eye, with macular edema: Secondary | ICD-10-CM | POA: Diagnosis not present

## 2022-07-09 DIAGNOSIS — E785 Hyperlipidemia, unspecified: Secondary | ICD-10-CM | POA: Diagnosis not present

## 2022-07-09 DIAGNOSIS — R7301 Impaired fasting glucose: Secondary | ICD-10-CM | POA: Diagnosis not present

## 2022-07-09 DIAGNOSIS — I700 Atherosclerosis of aorta: Secondary | ICD-10-CM | POA: Diagnosis not present

## 2022-07-09 DIAGNOSIS — R7989 Other specified abnormal findings of blood chemistry: Secondary | ICD-10-CM | POA: Diagnosis not present

## 2022-07-15 DIAGNOSIS — Z1331 Encounter for screening for depression: Secondary | ICD-10-CM | POA: Diagnosis not present

## 2022-07-15 DIAGNOSIS — Z1339 Encounter for screening examination for other mental health and behavioral disorders: Secondary | ICD-10-CM | POA: Diagnosis not present

## 2022-07-15 DIAGNOSIS — M8580 Other specified disorders of bone density and structure, unspecified site: Secondary | ICD-10-CM | POA: Diagnosis not present

## 2022-07-15 DIAGNOSIS — R7301 Impaired fasting glucose: Secondary | ICD-10-CM | POA: Diagnosis not present

## 2022-07-15 DIAGNOSIS — Z0000 Encounter for general adult medical examination without abnormal findings: Secondary | ICD-10-CM | POA: Diagnosis not present

## 2022-07-15 DIAGNOSIS — R011 Cardiac murmur, unspecified: Secondary | ICD-10-CM | POA: Diagnosis not present

## 2022-07-15 DIAGNOSIS — D649 Anemia, unspecified: Secondary | ICD-10-CM | POA: Diagnosis not present

## 2022-07-15 DIAGNOSIS — I700 Atherosclerosis of aorta: Secondary | ICD-10-CM | POA: Diagnosis not present

## 2022-07-15 DIAGNOSIS — E785 Hyperlipidemia, unspecified: Secondary | ICD-10-CM | POA: Diagnosis not present

## 2022-07-15 DIAGNOSIS — D126 Benign neoplasm of colon, unspecified: Secondary | ICD-10-CM | POA: Diagnosis not present

## 2022-07-15 DIAGNOSIS — H9190 Unspecified hearing loss, unspecified ear: Secondary | ICD-10-CM | POA: Diagnosis not present

## 2022-07-15 DIAGNOSIS — J449 Chronic obstructive pulmonary disease, unspecified: Secondary | ICD-10-CM | POA: Diagnosis not present

## 2022-07-16 DIAGNOSIS — L821 Other seborrheic keratosis: Secondary | ICD-10-CM | POA: Diagnosis not present

## 2022-07-16 DIAGNOSIS — Z85828 Personal history of other malignant neoplasm of skin: Secondary | ICD-10-CM | POA: Diagnosis not present

## 2022-07-17 DIAGNOSIS — Z23 Encounter for immunization: Secondary | ICD-10-CM | POA: Diagnosis not present

## 2022-07-21 ENCOUNTER — Other Ambulatory Visit (HOSPITAL_COMMUNITY): Payer: Self-pay | Admitting: Internal Medicine

## 2022-07-21 ENCOUNTER — Ambulatory Visit (INDEPENDENT_AMBULATORY_CARE_PROVIDER_SITE_OTHER): Payer: Medicare Other | Admitting: Nurse Practitioner

## 2022-07-21 ENCOUNTER — Encounter: Payer: Self-pay | Admitting: Nurse Practitioner

## 2022-07-21 VITALS — BP 136/62 | HR 59 | Ht 61.0 in | Wt 112.0 lb

## 2022-07-21 DIAGNOSIS — Z86010 Personal history of colonic polyps: Secondary | ICD-10-CM | POA: Diagnosis not present

## 2022-07-21 DIAGNOSIS — R011 Cardiac murmur, unspecified: Secondary | ICD-10-CM

## 2022-07-21 DIAGNOSIS — Z1212 Encounter for screening for malignant neoplasm of rectum: Secondary | ICD-10-CM | POA: Diagnosis not present

## 2022-07-21 NOTE — Progress Notes (Signed)
Reviewed. Okay with plan as outlined

## 2022-07-21 NOTE — Patient Instructions (Signed)
It has been recommended to you by your physician that you have a(n) Colonoscopy completed. Per your request, we did not schedule the procedure(s) today. Please contact our office at [phone] should you decide to have the procedure completed. You will be scheduled for a pre-visit and procedure at that time.  Thank you for trusting me with your gastrointestinal care!   Carl Best, CRNP

## 2022-07-21 NOTE — Progress Notes (Addendum)
     07/21/2022 Cindy Valencia [id number] 02/15/1941     Chief Complaint: Change in bowel habit   History of Present Illness: Cindy Valencia is an 82 year old female with a past medical history of arthritis, migraine headaches, hyperlipidemia, aortic atherosclerosis, COPD and colon polyps.  She is known by Dr. Perry.  She presents to our office today for further evaluation regarding mild anemia and a change in bowel pattern. She underwent laboratory studies by Dr. Mark Perini 07/09/2022 which showed a hemoglobin level of 11.1.  Hematocrit 37.4.  MCV 94.6.  BUN 14.  Creatinine 0.8.  Normal LFTs.  She reported undergoing iron levels and possible B12 level a few days ago, results not obtainable at this time.  She sometimes has difficulty swallowing large pills, no difficulty swallowing solid foods or liquids.  She rarely has heartburn which occurs if she overeats.  No upper or lower abdominal pain.  Around Thanksgiving 2023, she noticed a change in her bowel pattern which consisted of light caramel colored stools which were more narrow x 2 to 3 weeks then abated.  Since then, she is passing a normal formed brown stool daily.  No rectal bleeding or black stools.  She eats yogurt with fruit, nuts and flaxseed every morning.  She drinks less than 64 ounces of water daily.  She underwent an EGD 02/26/2021 due to having a CTAP which showed possible proximal gastric wall thickening which showed an incidental esophageal ring, a small hiatal hernia and otherwise was normal.  Her most recent colonoscopy was 01/25/2018 and 3 small tubular adenomatous polyps were removed from the cecum.  She was advised to repeat a colonoscopy in 5 years.  No known family history of colorectal cancer.  Mother had a ruptured colon, further details unclear.  She remains quite active at the age of 82.       Past Medical History:  Diagnosis Date   Allergy     Aortic atherosclerosis ([facility])     Arthritis     Cardiac murmur      Cataract     Colon polyp 12/2007   COPD (chronic obstructive pulmonary disease) ([facility])     Endometrial polyp 06/2006   Hyperlipidemia     IBS (irritable bowel syndrome)     Lipoma 2000    right upper chest wall   Migraine headache     Nutritional anemia     Osteopenia     Skin cancer     Skin cancer             Past Surgical History:  Procedure Laterality Date   CATARACT EXTRACTION Bilateral      central retinal vein occlusion in left eye   COLONOSCOPY       DILATATION & CURRETTAGE/HYSTEROSCOPY WITH RESECTOCOPE N/A 05/03/2013    Procedure: DILATATION & CURETTAGE/HYSTEROSCOPY WITH RESECTOCOPE;  Surgeon: Mary Suzanne Miller, MD;  Location: [facility];  Service: Gynecology;  Laterality: N/A;   MOLE REMOVAL       TONSILLECTOMY AND ADENOIDECTOMY       VAGINAL DELIVERY        x2     PAST GI PROCEDURES:   EGD 02/26/2021: 1. Incidental esophageal ring 2. Small hiatal hernia 3. Otherwise normal EGD.   Colonoscopy 01/25/2018: - Three 2 to 3 mm polyps in the cecum, removed with a cold snare. Resected and retrieved. - The examination was otherwise normal on direct and retroflexion views. -5 year colonoscopy recall  Surgical [P],   cecum, polyp (3) - TUBULAR ADENOMA (THREE). - NO HIGH GRADE DYSPLASIA OR MALIGNANCY         Current Outpatient Medications on File Prior to Visit  Medication Sig Dispense Refill   ALPHA LIPOIC ACID PO Take 600 mg by mouth 2 (two) times daily.       AMBULATORY NON FORMULARY MEDICATION Medication Name: Neuro-Mag Magnesium L- Threonate 144mg 2 capsules 1 hr before bedtime       ANORO ELLIPTA 62.5-25 MCG/INH AEPB SMARTSIG:1 Unspecified Via Inhaler Daily       Biotin 5000 MCG CAPS Take 5,000 mcg by mouth every evening.       Cholecalciferol (VITAMIN D3 PO) Take by mouth.       EVENING PRIMROSE OIL PO Take 1,300 mg by mouth 2 (two) times daily.       ezetimibe (ZETIA) 10 MG tablet Take 1 tablet by mouth daily.       fish oil-omega-3 fatty acids 1000 MG capsule  Take 1 g by mouth daily.       MAGNESIUM CITRATE PO Take 150 mg by mouth every evening.        Multiple Vitamin (MULTIVITAMIN WITH MINERALS) TABS tablet Take 1 tablet by mouth daily.       mupirocin ointment (BACTROBAN) 2 % Apply 1 Application topically as needed.       OVER THE COUNTER MEDICATION Take 1-2 tablets by mouth 2 (two) times daily. Tart Cherry (vitamin) 2 in AM and 1 in PM       Triamcinolone Acetonide (NASACORT ALLERGY 24HR NA) Place into the nose.       Ubrogepant (UBRELVY) 100 MG TABS Take 1 tablet by mouth as needed.       zolpidem (AMBIEN) 5 MG tablet 1 tablet at bedtime as needed for insomnia        No current facility-administered medications on file prior to visit.         Allergies  Allergen Reactions   Contrast Media [Iodinated Contrast Media] Anaphylaxis      And hives and throat closed   Codeine        REACTION: nausea   Epinephrine Palpitations      Increased heart rate    Current Medications, Allergies, Past Medical History, Past Surgical History, Family History and Social History were reviewed in [facility] electronic medical record.   Review of Systems:   Constitutional: Negative for fever, sweats, chills or weight loss.  Respiratory: Negative for shortness of breath.   Cardiovascular: Negative for chest pain, palpitations and leg swelling.  Gastrointestinal: See HPI.  Musculoskeletal: Negative for back pain or muscle aches.  Neurological: Negative for dizziness, headaches or paresthesias.    Physical Exam: Ht 5' 1" (1.549 m)   Wt 112 lb (50.8 kg)   LMP 07/14/1992   BMI 21.16 kg/m     Wt Readings from Last 3 Encounters:  07/21/22 112 lb (50.8 kg)  02/26/21 110 lb (49.9 kg)  02/25/21 110 lb 4 oz (50 kg)    General: in no acute distress. Head: Normocephalic and atraumatic. Eyes: No scleral icterus. Conjunctiva pink . Ears: Normal auditory acuity. Mouth: Dentition intact. No ulcers or lesions.  Lungs: Clear throughout to  auscultation. Heart: Regular rate and rhythm, ? very soft murmur.  Abdomen: Soft, nontender and nondistended. No masses or hepatomegaly. Normal bowel sounds x 4 quadrants.  Rectal: Deferred. Musculoskeletal: Symmetrical with no gross deformities. Extremities: No edema. Neurological: Alert oriented x 4. No focal deficits.  Psychological:   Alert and cooperative. Normal mood and affect   Assessment and Recommendations:   1) 81-year-old female with a change in bowel pattern 05/2022 which lasted 2 to 3 weeks then abated.  No abdominal pain. -Recommended increasing water intake to 64 ounces daily -Dietary fiber as tolerated -See plan in #2   2) History of colon polyps.  Colonoscopy 01/25/2018 identified three 2 to 3 mm tubular adenomatous polyps removed from the cecum. -Colonoscopy benefits and risks discussed including risk with sedation, risk of bleeding, perforation and infection  -Patient will contact her office when she is ready to schedule a colonoscopy   3) Mild normocytic anemia per labs 07/09/2022.  Hemoglobin 11.1.  MCV 94.6. -Patient reported undergoing iron studies and possibly had a vitamin B12 level done a few days ago, will request a copy of these lab results from Dr. Perini's office for further review.  If she is iron deficient, to consider celiac serology and possible repeat EGD.   ADDENDUM: LABS FROM PCP 07/15/2022: Hg 13.9. HCT 39.9. Ferritin 71. Iron 78. Iron saturation 25%. B12 level 923. IFOBT negative.    4) Pill dysphagia.  Incidental esophageal ring and a small hiatal hernia identified per EGD 02/2021.     

## 2022-07-31 DIAGNOSIS — Z961 Presence of intraocular lens: Secondary | ICD-10-CM | POA: Diagnosis not present

## 2022-07-31 DIAGNOSIS — H34812 Central retinal vein occlusion, left eye, with macular edema: Secondary | ICD-10-CM | POA: Diagnosis not present

## 2022-08-01 ENCOUNTER — Encounter: Payer: Self-pay | Admitting: Internal Medicine

## 2022-08-18 ENCOUNTER — Other Ambulatory Visit (HOSPITAL_COMMUNITY): Payer: BLUE CROSS/BLUE SHIELD

## 2022-08-18 DIAGNOSIS — I700 Atherosclerosis of aorta: Secondary | ICD-10-CM | POA: Diagnosis not present

## 2022-08-18 DIAGNOSIS — E785 Hyperlipidemia, unspecified: Secondary | ICD-10-CM | POA: Diagnosis not present

## 2022-08-21 ENCOUNTER — Ambulatory Visit (HOSPITAL_COMMUNITY): Payer: Medicare Other | Attending: Internal Medicine

## 2022-08-21 DIAGNOSIS — R011 Cardiac murmur, unspecified: Secondary | ICD-10-CM | POA: Diagnosis not present

## 2022-08-21 LAB — ECHOCARDIOGRAM COMPLETE
Area-P 1/2: 3.72 cm2
S' Lateral: 1.9 cm

## 2022-08-28 ENCOUNTER — Ambulatory Visit (AMBULATORY_SURGERY_CENTER): Payer: Medicare Other | Admitting: *Deleted

## 2022-08-28 VITALS — Ht 61.0 in | Wt 109.0 lb

## 2022-08-28 DIAGNOSIS — Z86010 Personal history of colonic polyps: Secondary | ICD-10-CM

## 2022-08-28 DIAGNOSIS — K589 Irritable bowel syndrome without diarrhea: Secondary | ICD-10-CM | POA: Insufficient documentation

## 2022-08-28 MED ORDER — NA SULFATE-K SULFATE-MG SULF 17.5-3.13-1.6 GM/177ML PO SOLN: 1.0000 | Freq: Once | ORAL | 0 refills | Status: AC

## 2022-08-28 NOTE — Progress Notes (Signed)
No egg or soy allergy known to patient  No issues known to pt with past sedation with any surgeries or procedures Patient denies ever being told they had issues or difficulty with intubation  No FH of Malignant Hyperthermia Pt is not on diet pills Pt is not on  home 02  Pt is not on blood thinners  Pt denies issues with constipation  Pt is not on dialysis Pt denies any upcoming cardiac testing Pt encouraged to use to use Singlecare or Goodrx to reduce cost  Patient's chart reviewed by Osvaldo Angst CNRA prior to previsit and patient appropriate for the [place].  Previsit completed and red dot placed by patient's name on their procedure day (on provider's schedule).  . Visit by phone Instructions sent by mail and my chart

## 2022-09-15 DIAGNOSIS — L570 Actinic keratosis: Secondary | ICD-10-CM | POA: Diagnosis not present

## 2022-09-15 DIAGNOSIS — D1801 Hemangioma of skin and subcutaneous tissue: Secondary | ICD-10-CM | POA: Diagnosis not present

## 2022-09-15 DIAGNOSIS — Z85828 Personal history of other malignant neoplasm of skin: Secondary | ICD-10-CM | POA: Diagnosis not present

## 2022-09-15 DIAGNOSIS — L565 Disseminated superficial actinic porokeratosis (DSAP): Secondary | ICD-10-CM | POA: Diagnosis not present

## 2022-09-15 DIAGNOSIS — L821 Other seborrheic keratosis: Secondary | ICD-10-CM | POA: Diagnosis not present

## 2022-09-15 DIAGNOSIS — D225 Melanocytic nevi of trunk: Secondary | ICD-10-CM | POA: Diagnosis not present

## 2022-09-15 DIAGNOSIS — D692 Other nonthrombocytopenic purpura: Secondary | ICD-10-CM | POA: Diagnosis not present

## 2022-09-16 ENCOUNTER — Ambulatory Visit (AMBULATORY_SURGERY_CENTER): Payer: Medicare Other | Admitting: Internal Medicine

## 2022-09-16 ENCOUNTER — Encounter: Payer: Self-pay | Admitting: Internal Medicine

## 2022-09-16 VITALS — BP 103/70 | HR 53 | Temp 98.0°F | Resp 11 | Ht 61.0 in | Wt 109.0 lb

## 2022-09-16 DIAGNOSIS — Z86010 Personal history of colonic polyps: Secondary | ICD-10-CM

## 2022-09-16 DIAGNOSIS — E785 Hyperlipidemia, unspecified: Secondary | ICD-10-CM | POA: Diagnosis not present

## 2022-09-16 DIAGNOSIS — R194 Change in bowel habit: Secondary | ICD-10-CM

## 2022-09-16 DIAGNOSIS — Z09 Encounter for follow-up examination after completed treatment for conditions other than malignant neoplasm: Secondary | ICD-10-CM

## 2022-09-16 DIAGNOSIS — J449 Chronic obstructive pulmonary disease, unspecified: Secondary | ICD-10-CM | POA: Diagnosis not present

## 2022-09-16 MED ORDER — SODIUM CHLORIDE 0.9 % IV SOLN: 500.0000 mL | INTRAVENOUS | Status: DC

## 2022-09-16 NOTE — Progress Notes (Signed)
Pt's states no medical or surgical changes since previsit or office visit. 

## 2022-09-16 NOTE — Progress Notes (Signed)
A/ox3, pleased with MAC, report to RN 

## 2022-09-16 NOTE — Progress Notes (Signed)
07/21/2022 Cindy Valencia [id number] 06/24/1941     Chief Complaint: Change in bowel habit   History of Present Illness: Cindy Valencia is an 82 year old female with a past medical history of arthritis, migraine headaches, hyperlipidemia, aortic atherosclerosis, COPD and colon polyps.  She is known by Dr. Henrene Pastor.  She presents to our office today for further evaluation regarding mild anemia and a change in bowel pattern. She underwent laboratory studies by Dr. Crist Infante 07/09/2022 which showed a hemoglobin level of 11.1.  Hematocrit 37.4.  MCV 94.6.  BUN 14.  Creatinine 0.8.  Normal LFTs.  She reported undergoing iron levels and possible B12 level a few days ago, results not obtainable at this time.  She sometimes has difficulty swallowing large pills, no difficulty swallowing solid foods or liquids.  She rarely has heartburn which occurs if she overeats.  No upper or lower abdominal pain.  Around Thanksgiving 2023, she noticed a change in her bowel pattern which consisted of light caramel colored stools which were more narrow x 2 to 3 weeks then abated.  Since then, she is passing a normal formed brown stool daily.  No rectal bleeding or black stools.  She eats yogurt with fruit, nuts and flaxseed every morning.  She drinks less than 64 ounces of water daily.  She underwent an EGD 02/26/2021 due to having a CTAP which showed possible proximal gastric wall thickening which showed an incidental esophageal ring, a small hiatal hernia and otherwise was normal.  Her most recent colonoscopy was 01/25/2018 and 3 small tubular adenomatous polyps were removed from the cecum.  She was advised to repeat a colonoscopy in 5 years.  No known family history of colorectal cancer.  Mother had a ruptured colon, further details unclear.  She remains quite active at the age of 21.       Past Medical History:  Diagnosis Date   Allergy     Aortic atherosclerosis (HCC)     Arthritis     Cardiac murmur      Cataract     Colon polyp 12/2007   COPD (chronic obstructive pulmonary disease) (HCC)     Endometrial polyp 06/2006   Hyperlipidemia     IBS (irritable bowel syndrome)     Lipoma 2000    right upper chest wall   Migraine headache     Nutritional anemia     Osteopenia     Skin cancer     Skin cancer             Past Surgical History:  Procedure Laterality Date   CATARACT EXTRACTION Bilateral      central retinal vein occlusion in left eye   COLONOSCOPY       DILATATION & CURRETTAGE/HYSTEROSCOPY WITH RESECTOCOPE N/A 05/03/2013    Procedure: [place];  Surgeon: Lyman Speller, MD;  Location: [facility];  Service: Gynecology;  Laterality: N/A;   MOLE REMOVAL       TONSILLECTOMY AND ADENOIDECTOMY       VAGINAL DELIVERY        x2     PAST GI PROCEDURES:   EGD 02/26/2021: 1. Incidental esophageal ring 2. Small hiatal hernia 3. Otherwise normal EGD.   Colonoscopy 01/25/2018: - Three 2 to 3 mm polyps in the cecum, removed with a cold snare. Resected and retrieved. - The examination was otherwise normal on direct and retroflexion views. -5 year colonoscopy recall  Surgical [P],  cecum, polyp (3) - TUBULAR ADENOMA (THREE). - NO HIGH GRADE DYSPLASIA OR MALIGNANCY         Current Outpatient Medications on File Prior to Visit  Medication Sig Dispense Refill   ALPHA LIPOIC ACID PO Take 600 mg by mouth 2 (two) times daily.       AMBULATORY NON FORMULARY MEDICATION Medication Name: Neuro-Mag Magnesium L- Threonate '144mg'$  2 capsules 1 hr before bedtime       ANORO ELLIPTA 62.5-25 MCG/INH AEPB SMARTSIG:1 Unspecified Via Inhaler Daily       Biotin 5000 MCG CAPS Take 5,000 mcg by mouth every evening.       Cholecalciferol (VITAMIN D3 PO) Take by mouth.       EVENING PRIMROSE OIL PO Take 1,300 mg by mouth 2 (two) times daily.       ezetimibe (ZETIA) 10 MG tablet Take 1 tablet by mouth daily.       fish oil-omega-3 fatty acids 1000 MG capsule  Take 1 g by mouth daily.       MAGNESIUM CITRATE PO Take 150 mg by mouth every evening.        Multiple Vitamin (MULTIVITAMIN WITH MINERALS) TABS tablet Take 1 tablet by mouth daily.       mupirocin ointment (BACTROBAN) 2 % Apply 1 Application topically as needed.       OVER THE COUNTER MEDICATION Take 1-2 tablets by mouth 2 (two) times daily. Tart Cherry (vitamin) 2 in AM and 1 in PM       Triamcinolone Acetonide (NASACORT ALLERGY 24HR NA) Place into the nose.       Ubrogepant (UBRELVY) 100 MG TABS Take 1 tablet by mouth as needed.       zolpidem (AMBIEN) 5 MG tablet 1 tablet at bedtime as needed for insomnia        No current facility-administered medications on file prior to visit.         Allergies  Allergen Reactions   Contrast Media [Iodinated Contrast Media] Anaphylaxis      And hives and throat closed   Codeine        REACTION: nausea   Epinephrine Palpitations      Increased heart rate    Current Medications, Allergies, Past Medical History, Past Surgical History, Family History and Social History were reviewed in [employer] record.   Review of Systems:   Constitutional: Negative for fever, sweats, chills or weight loss.  Respiratory: Negative for shortness of breath.   Cardiovascular: Negative for chest pain, palpitations and leg swelling.  Gastrointestinal: See HPI.  Musculoskeletal: Negative for back pain or muscle aches.  Neurological: Negative for dizziness, headaches or paresthesias.    Physical Exam: Ht '5\' 1"'$  (1.549 m)   Wt 112 lb (50.8 kg)   LMP 07/14/1992   BMI 21.16 kg/m     Wt Readings from Last 3 Encounters:  07/21/22 112 lb (50.8 kg)  02/26/21 110 lb (49.9 kg)  02/25/21 110 lb 4 oz (50 kg)    General: in no acute distress. Head: Normocephalic and atraumatic. Eyes: No scleral icterus. Conjunctiva pink . Ears: Normal auditory acuity. Mouth: Dentition intact. No ulcers or lesions.  Lungs: Clear throughout to  auscultation. Heart: Regular rate and rhythm, ? very soft murmur.  Abdomen: Soft, nontender and nondistended. No masses or hepatomegaly. Normal bowel sounds x 4 quadrants.  Rectal: Deferred. Musculoskeletal: Symmetrical with no gross deformities. Extremities: No edema. Neurological: Alert oriented x 4. No focal deficits.  Psychological:  Alert and cooperative. Normal mood and affect   Assessment and Recommendations:   56) 82 year old female with a change in bowel pattern 05/2022 which lasted 2 to 3 weeks then abated.  No abdominal pain. -Recommended increasing water intake to 64 ounces daily -Dietary fiber as tolerated -See plan in #2   2) History of colon polyps.  Colonoscopy 01/25/2018 identified three 2 to 3 mm tubular adenomatous polyps removed from the cecum. -Colonoscopy benefits and risks discussed including risk with sedation, risk of bleeding, perforation and infection  -Patient will contact her office when she is ready to schedule a colonoscopy   3) Mild normocytic anemia per labs 07/09/2022.  Hemoglobin 11.1.  MCV 94.6. -Patient reported undergoing iron studies and possibly had a vitamin B12 level done a few days ago, will request a copy of these lab results from Dr. Silvestre Mesi office for further review.  If she is iron deficient, to consider celiac serology and possible repeat EGD.   ADDENDUM: LABS FROM PCP 07/15/2022: Hg 13.9. HCT 39.9. Ferritin 71. Iron 78. Iron saturation 25%. B12 level 923. IFOBT negative.    4) Pill dysphagia.  Incidental esophageal ring and a small hiatal hernia identified per EGD 02/2021.

## 2022-09-16 NOTE — Op Note (Signed)
[facility] Patient Name: Cindy Valencia Procedure Date: 09/16/2022 8:40 AM MRN: [id number] Endoscopist: Docia Chuck. Henrene Pastor , MD, [id number] Age: 82 Referring MD:  Date of Birth: 11/06/1940 Gender: Female Account #: [id number] Procedure:                Colonoscopy Indications:              High risk colon cancer surveillance: Personal                            history of adenoma with villous component. Previous                            examinations 2006, 2009, 2019. Recent office                            evaluation for change in bowel habits. Medicines:                Monitored Anesthesia Care Procedure:                Pre-Anesthesia Assessment:                           - Prior to the procedure, a History and Physical                            was performed, and patient medications and                            allergies were reviewed. The patient's tolerance of                            previous anesthesia was also reviewed. The risks                            and benefits of the procedure and the sedation                            options and risks were discussed with the patient.                            All questions were answered, and informed consent                            was obtained. Prior Anticoagulants: The patient has                            taken no anticoagulant or antiplatelet agents. ASA                            Grade Assessment: II - A patient with mild systemic                            disease. After reviewing the risks and benefits,  the patient was deemed in satisfactory condition to                            undergo the procedure.                           After obtaining informed consent, the colonoscope                            was passed under direct vision. Throughout the                            procedure, the patient's blood pressure, pulse, and                            oxygen saturations were  monitored continuously. The                            Olympus [id number]) Colonoscope was                            introduced through the anus and advanced to the the                            cecum, identified by appendiceal orifice and                            ileocecal valve. The ileocecal valve, appendiceal                            orifice, and rectum were photographed. The quality                            of the bowel preparation was excellent. The                            colonoscopy was performed without difficulty. The                            patient tolerated the procedure well. The bowel                            preparation used was SUPREP via split dose                            instruction. Scope In: 8:56:43 AM Scope Out: 9:09:48 AM Scope Withdrawal Time: 0 hours 8 minutes 57 seconds  Total Procedure Duration: 0 hours 13 minutes 5 seconds  Findings:                 The entire examined colon appeared normal on direct                            and retroflexion views. Complications:            No immediate  complications. Estimated blood loss:                            None. Estimated Blood Loss:     Estimated blood loss: none. Impression:               - The entire examined colon is normal on direct and                            retroflexion views.                           - No specimens collected. Recommendation:           - Repeat colonoscopy is not recommended for                            surveillance.                           - Patient has a contact number available for                            emergencies. The signs and symptoms of potential                            delayed complications were discussed with the                            patient. Return to normal activities tomorrow.                            Written discharge instructions were provided to the                            patient.                           - Resume  previous diet.                           - Continue present medications.                           - Await pathology results. Docia Chuck. Henrene Pastor, MD 09/16/2022 9:12:55 AM This report has been signed electronically.

## 2022-09-16 NOTE — Patient Instructions (Addendum)
Repeat colonoscopy is not recommended for surveillance. Resume previous diet. Continue present medications.                                            YOU HAD AN ENDOSCOPIC PROCEDURE TODAY AT [facility]:   Refer to the procedure report that was given to you for any specific questions about what was found during the examination.  If the procedure report does not answer your questions, please call your gastroenterologist to clarify.  If you requested that your care partner not be given the details of your procedure findings, then the procedure report has been included in a sealed envelope for you to review at your convenience later.  YOU SHOULD EXPECT: Some feelings of bloating in the abdomen. Passage of more gas than usual.  Walking can help get rid of the air that was put into your GI tract during the procedure and reduce the bloating. If you had a lower endoscopy (such as a colonoscopy or flexible sigmoidoscopy) you may notice spotting of blood in your stool or on the toilet paper. If you underwent a bowel prep for your procedure, you may not have a normal bowel movement for a few days.  Please Note:  You might notice some irritation and congestion in your nose or some drainage.  This is from the oxygen used during your procedure.  There is no need for concern and it should clear up in a day or so.  SYMPTOMS TO REPORT IMMEDIATELY:  Following lower endoscopy (colonoscopy or flexible sigmoidoscopy):  Excessive amounts of blood in the stool  Significant tenderness or worsening of abdominal pains  Swelling of the abdomen that is new, acute  Fever of 100F or higher For urgent or emergent issues, a gastroenterologist can be reached at any hour by calling [phone]. Do not use MyChart messaging for urgent concerns.    DIET:  We do recommend a small meal at first, but then you may proceed to your regular diet.  Drink plenty of fluids but you should avoid alcoholic beverages for  24 hours.  ACTIVITY:  You should plan to take it easy for the rest of today and you should NOT DRIVE or use heavy machinery until tomorrow (because of the sedation medicines used during the test).    FOLLOW UP: Our staff will call the number listed on your records the next business day following your procedure.  We will call around 7:15- 8:00 am to check on you and address any questions or concerns that you may have regarding the information given to you following your procedure. If we do not reach you, we will leave a message.     SIGNATURES/CONFIDENTIALITY: You and/or your care partner have signed paperwork which will be entered into your electronic medical record.  These signatures attest to the fact that that the information above on your After Visit Summary has been reviewed and is understood.  Full responsibility of the confidentiality of this discharge information lies with you and/or your care-partner.

## 2022-09-17 ENCOUNTER — Telehealth: Payer: Self-pay

## 2022-09-17 NOTE — Telephone Encounter (Signed)
  Follow up Call-     09/16/2022    7:59 AM 02/26/2021    2:55 PM  Call back number  Post procedure Call Back phone  # [phone]  Permission to leave phone message Yes Yes     Patient questions:  Do you have a fever, pain , or abdominal swelling? No. Pain Score  0 *  Have you tolerated food without any problems? Yes.    Have you been able to return to your normal activities? Yes.    Do you have any questions about your discharge instructions: Diet   No. Medications  No. Follow up visit  No.  Do you have questions or concerns about your Care? No.  Actions: * If pain score is 4 or above: No action needed, pain <4.

## 2022-10-02 DIAGNOSIS — Z961 Presence of intraocular lens: Secondary | ICD-10-CM | POA: Diagnosis not present

## 2022-10-02 DIAGNOSIS — H34812 Central retinal vein occlusion, left eye, with macular edema: Secondary | ICD-10-CM | POA: Diagnosis not present

## 2022-10-13 DIAGNOSIS — H6123 Impacted cerumen, bilateral: Secondary | ICD-10-CM | POA: Diagnosis not present

## 2022-10-16 DIAGNOSIS — M8589 Other specified disorders of bone density and structure, multiple sites: Secondary | ICD-10-CM | POA: Diagnosis not present

## 2022-11-03 ENCOUNTER — Other Ambulatory Visit: Payer: Self-pay | Admitting: Internal Medicine

## 2022-11-03 DIAGNOSIS — Z1231 Encounter for screening mammogram for malignant neoplasm of breast: Secondary | ICD-10-CM

## 2022-11-13 DIAGNOSIS — Z961 Presence of intraocular lens: Secondary | ICD-10-CM | POA: Diagnosis not present

## 2022-11-13 DIAGNOSIS — H34812 Central retinal vein occlusion, left eye, with macular edema: Secondary | ICD-10-CM | POA: Diagnosis not present

## 2022-11-18 ENCOUNTER — Ambulatory Visit
Admission: RE | Admit: 2022-11-18 | Discharge: 2022-11-18 | Disposition: A | Discharge status: Home / Self Care | Payer: Medicare Other | Source: Ambulatory Visit | Attending: Internal Medicine | Admitting: Internal Medicine

## 2022-11-18 DIAGNOSIS — Z1231 Encounter for screening mammogram for malignant neoplasm of breast: Secondary | ICD-10-CM

## 2023-01-01 DIAGNOSIS — H34812 Central retinal vein occlusion, left eye, with macular edema: Secondary | ICD-10-CM | POA: Diagnosis not present

## 2023-02-26 DIAGNOSIS — H34812 Central retinal vein occlusion, left eye, with macular edema: Secondary | ICD-10-CM | POA: Diagnosis not present

## 2023-02-26 DIAGNOSIS — Z961 Presence of intraocular lens: Secondary | ICD-10-CM | POA: Diagnosis not present

## 2023-03-26 DIAGNOSIS — H34812 Central retinal vein occlusion, left eye, with macular edema: Secondary | ICD-10-CM | POA: Diagnosis not present

## 2023-03-26 DIAGNOSIS — Z961 Presence of intraocular lens: Secondary | ICD-10-CM | POA: Diagnosis not present

## 2023-04-03 DIAGNOSIS — H348122 Central retinal vein occlusion, left eye, stable: Secondary | ICD-10-CM | POA: Diagnosis not present

## 2023-04-03 DIAGNOSIS — J3489 Other specified disorders of nose and nasal sinuses: Secondary | ICD-10-CM | POA: Diagnosis not present

## 2023-04-03 DIAGNOSIS — E785 Hyperlipidemia, unspecified: Secondary | ICD-10-CM | POA: Diagnosis not present

## 2023-04-03 DIAGNOSIS — R011 Cardiac murmur, unspecified: Secondary | ICD-10-CM | POA: Diagnosis not present

## 2023-04-03 DIAGNOSIS — R7301 Impaired fasting glucose: Secondary | ICD-10-CM | POA: Diagnosis not present

## 2023-04-03 DIAGNOSIS — H9190 Unspecified hearing loss, unspecified ear: Secondary | ICD-10-CM | POA: Diagnosis not present

## 2023-04-03 DIAGNOSIS — D126 Benign neoplasm of colon, unspecified: Secondary | ICD-10-CM | POA: Diagnosis not present

## 2023-04-03 DIAGNOSIS — D72819 Decreased white blood cell count, unspecified: Secondary | ICD-10-CM | POA: Diagnosis not present

## 2023-04-03 DIAGNOSIS — Z636 Dependent relative needing care at home: Secondary | ICD-10-CM | POA: Diagnosis not present

## 2023-04-03 DIAGNOSIS — Z23 Encounter for immunization: Secondary | ICD-10-CM | POA: Diagnosis not present

## 2023-04-03 DIAGNOSIS — I700 Atherosclerosis of aorta: Secondary | ICD-10-CM | POA: Diagnosis not present

## 2023-04-03 DIAGNOSIS — J449 Chronic obstructive pulmonary disease, unspecified: Secondary | ICD-10-CM | POA: Diagnosis not present

## 2023-04-07 DIAGNOSIS — Z961 Presence of intraocular lens: Secondary | ICD-10-CM | POA: Diagnosis not present

## 2023-04-07 DIAGNOSIS — H524 Presbyopia: Secondary | ICD-10-CM | POA: Diagnosis not present

## 2023-04-07 DIAGNOSIS — H349 Unspecified retinal vascular occlusion: Secondary | ICD-10-CM | POA: Diagnosis not present

## 2023-04-22 DIAGNOSIS — Z23 Encounter for immunization: Secondary | ICD-10-CM | POA: Diagnosis not present

## 2023-04-23 DIAGNOSIS — L2989 Other pruritus: Secondary | ICD-10-CM | POA: Diagnosis not present

## 2023-04-23 DIAGNOSIS — L570 Actinic keratosis: Secondary | ICD-10-CM | POA: Diagnosis not present

## 2023-04-23 DIAGNOSIS — Z85828 Personal history of other malignant neoplasm of skin: Secondary | ICD-10-CM | POA: Diagnosis not present

## 2023-04-23 DIAGNOSIS — D225 Melanocytic nevi of trunk: Secondary | ICD-10-CM | POA: Diagnosis not present

## 2023-04-23 DIAGNOSIS — D2261 Melanocytic nevi of right upper limb, including shoulder: Secondary | ICD-10-CM | POA: Diagnosis not present

## 2023-04-23 DIAGNOSIS — L821 Other seborrheic keratosis: Secondary | ICD-10-CM | POA: Diagnosis not present

## 2023-05-11 ENCOUNTER — Encounter (INDEPENDENT_AMBULATORY_CARE_PROVIDER_SITE_OTHER): Payer: Self-pay

## 2023-05-11 ENCOUNTER — Ambulatory Visit (INDEPENDENT_AMBULATORY_CARE_PROVIDER_SITE_OTHER): Payer: Medicare Other | Admitting: Otolaryngology

## 2023-05-11 VITALS — Ht 61.0 in | Wt 105.0 lb

## 2023-05-11 DIAGNOSIS — H6123 Impacted cerumen, bilateral: Secondary | ICD-10-CM | POA: Diagnosis not present

## 2023-05-11 DIAGNOSIS — R040 Epistaxis: Secondary | ICD-10-CM | POA: Insufficient documentation

## 2023-05-11 DIAGNOSIS — H903 Sensorineural hearing loss, bilateral: Secondary | ICD-10-CM | POA: Insufficient documentation

## 2023-05-11 NOTE — Progress Notes (Signed)
Patient ID: Cindy Valencia, female   DOB: 01-Mar-1941, 82 y.o.   MRN: [id number]  Follow up: Hearing loss, recurrent cerumen impaction, epistaxis  HPI: The patient is an 82 year old female who returns today for her follow-up evaluation.  The patient was previously seen for hearing loss, recurrent cerumen impaction, and epistaxis.  She was treated with cauterization of her nasal septum and cerumen disimpaction.  She was also fitted with bilateral hearing aids by an outside audiologist.  The patient reports no recent epistaxis.  She still has hearing difficulty, especially in noisy environments.  However, she does not use her hearing aids regularly.  Currently she complains of increasing clogging sensation in her ears.  She denies any significant otalgia, otorrhea, or vertigo.  Exam: General: Communicates without difficulty, well nourished, no acute distress. Head: Normocephalic, no evidence injury, no tenderness, facial buttresses intact without stepoff. Face/sinus: No tenderness to palpation and percussion. Facial movement is normal and symmetric. Eyes: PERRL, EOMI. No scleral icterus, conjunctivae clear. Neuro: CN II exam reveals vision grossly intact.  No nystagmus at any point of gaze. Ears: Auricles well formed without lesions.  Bilateral cerumen impaction.  Nose: External evaluation reveals normal support and skin without lesions.  Dorsum is intact.  Anterior rhinoscopy reveals congested mucosa over anterior aspect of inferior turbinates and intact septum.  No purulence noted. Oral:  Oral cavity and oropharynx are intact, symmetric, without erythema or edema.  Mucosa is moist without lesions. Neck: Full range of motion without pain.  There is no significant lymphadenopathy.  No masses palpable.  Thyroid bed within normal limits to palpation.  Parotid glands and submandibular glands equal bilaterally without mass.  Trachea is midline. Neuro:  CN 2-12 grossly intact.    Procedure: Bilateral cerumen  disimpaction Anesthesia: None Description: Under the operating microscope, the cerumen is carefully removed with a combination of cerumen currette, alligator forceps, and suction catheters.  After the cerumen is removed, the TMs are noted to be normal.  No mass, erythema, or lesions. The patient tolerated the procedure well.    Assessment: 1.  Bilateral cerumen impaction.  After the disimpaction procedure, both tympanic membranes and middle ear spaces are noted to be normal. 2.  The patient has no recent recurrent epistaxis.  Her nasal mucosa and nasal septum are all normal without any significant hypervascular areas. 3.  Bilateral sensorineural hearing loss.  Plan: 1.  Otomicroscopy with bilateral cerumen disimpaction. 2.  The physical exam findings are reviewed with the patient. 3.  The patient is encouraged to use her hearing aids regularly. 4.  The patient is encouraged to use a humidifier during the winter months. 5.  The patient will return for reevaluation in 6 months.

## 2023-06-04 DIAGNOSIS — Z961 Presence of intraocular lens: Secondary | ICD-10-CM | POA: Diagnosis not present

## 2023-06-04 DIAGNOSIS — H34812 Central retinal vein occlusion, left eye, with macular edema: Secondary | ICD-10-CM | POA: Diagnosis not present

## 2023-07-30 DIAGNOSIS — H34812 Central retinal vein occlusion, left eye, with macular edema: Secondary | ICD-10-CM | POA: Diagnosis not present

## 2023-07-30 DIAGNOSIS — Z961 Presence of intraocular lens: Secondary | ICD-10-CM | POA: Diagnosis not present

## 2023-08-12 DIAGNOSIS — L6611 Classic lichen planopilaris: Secondary | ICD-10-CM | POA: Diagnosis not present

## 2023-08-12 DIAGNOSIS — L84 Corns and callosities: Secondary | ICD-10-CM | POA: Diagnosis not present

## 2023-08-12 DIAGNOSIS — I788 Other diseases of capillaries: Secondary | ICD-10-CM | POA: Diagnosis not present

## 2023-08-12 DIAGNOSIS — L218 Other seborrheic dermatitis: Secondary | ICD-10-CM | POA: Diagnosis not present

## 2023-08-12 DIAGNOSIS — Z85828 Personal history of other malignant neoplasm of skin: Secondary | ICD-10-CM | POA: Diagnosis not present

## 2023-08-12 DIAGNOSIS — L821 Other seborrheic keratosis: Secondary | ICD-10-CM | POA: Diagnosis not present

## 2023-08-12 DIAGNOSIS — L570 Actinic keratosis: Secondary | ICD-10-CM | POA: Diagnosis not present

## 2023-09-08 DIAGNOSIS — M8580 Other specified disorders of bone density and structure, unspecified site: Secondary | ICD-10-CM | POA: Diagnosis not present

## 2023-09-08 DIAGNOSIS — Z1389 Encounter for screening for other disorder: Secondary | ICD-10-CM | POA: Diagnosis not present

## 2023-09-08 DIAGNOSIS — E785 Hyperlipidemia, unspecified: Secondary | ICD-10-CM | POA: Diagnosis not present

## 2023-09-08 DIAGNOSIS — R7301 Impaired fasting glucose: Secondary | ICD-10-CM | POA: Diagnosis not present

## 2023-09-08 DIAGNOSIS — I700 Atherosclerosis of aorta: Secondary | ICD-10-CM | POA: Diagnosis not present

## 2023-09-08 LAB — LAB REPORT - SCANNED
A1c: 5.4
EGFR (Non-African Amer.): 95.7
TSH: 2.6

## 2023-09-15 DIAGNOSIS — R7301 Impaired fasting glucose: Secondary | ICD-10-CM | POA: Diagnosis not present

## 2023-09-15 DIAGNOSIS — M7918 Myalgia, other site: Secondary | ICD-10-CM | POA: Diagnosis not present

## 2023-09-15 DIAGNOSIS — I700 Atherosclerosis of aorta: Secondary | ICD-10-CM | POA: Diagnosis not present

## 2023-09-15 DIAGNOSIS — J309 Allergic rhinitis, unspecified: Secondary | ICD-10-CM | POA: Diagnosis not present

## 2023-09-15 DIAGNOSIS — J449 Chronic obstructive pulmonary disease, unspecified: Secondary | ICD-10-CM | POA: Diagnosis not present

## 2023-09-15 DIAGNOSIS — H9190 Unspecified hearing loss, unspecified ear: Secondary | ICD-10-CM | POA: Diagnosis not present

## 2023-09-15 DIAGNOSIS — R011 Cardiac murmur, unspecified: Secondary | ICD-10-CM | POA: Diagnosis not present

## 2023-09-15 DIAGNOSIS — M8580 Other specified disorders of bone density and structure, unspecified site: Secondary | ICD-10-CM | POA: Diagnosis not present

## 2023-09-15 DIAGNOSIS — Z0000 Encounter for general adult medical examination without abnormal findings: Secondary | ICD-10-CM | POA: Diagnosis not present

## 2023-09-15 DIAGNOSIS — E785 Hyperlipidemia, unspecified: Secondary | ICD-10-CM | POA: Diagnosis not present

## 2023-09-15 DIAGNOSIS — G629 Polyneuropathy, unspecified: Secondary | ICD-10-CM | POA: Diagnosis not present

## 2023-09-15 DIAGNOSIS — R82998 Other abnormal findings in urine: Secondary | ICD-10-CM | POA: Diagnosis not present

## 2023-09-15 DIAGNOSIS — J3489 Other specified disorders of nose and nasal sinuses: Secondary | ICD-10-CM | POA: Diagnosis not present

## 2023-09-28 DIAGNOSIS — M7918 Myalgia, other site: Secondary | ICD-10-CM | POA: Diagnosis not present

## 2023-09-28 DIAGNOSIS — M25552 Pain in left hip: Secondary | ICD-10-CM | POA: Diagnosis not present

## 2023-09-28 DIAGNOSIS — R269 Unspecified abnormalities of gait and mobility: Secondary | ICD-10-CM | POA: Diagnosis not present

## 2023-10-05 DIAGNOSIS — M25552 Pain in left hip: Secondary | ICD-10-CM | POA: Diagnosis not present

## 2023-10-05 DIAGNOSIS — M7918 Myalgia, other site: Secondary | ICD-10-CM | POA: Diagnosis not present

## 2023-10-05 DIAGNOSIS — R269 Unspecified abnormalities of gait and mobility: Secondary | ICD-10-CM | POA: Diagnosis not present

## 2023-10-06 DIAGNOSIS — M79672 Pain in left foot: Secondary | ICD-10-CM | POA: Diagnosis not present

## 2023-10-06 DIAGNOSIS — M25552 Pain in left hip: Secondary | ICD-10-CM | POA: Diagnosis not present

## 2023-10-14 DIAGNOSIS — M7918 Myalgia, other site: Secondary | ICD-10-CM | POA: Diagnosis not present

## 2023-10-14 DIAGNOSIS — R269 Unspecified abnormalities of gait and mobility: Secondary | ICD-10-CM | POA: Diagnosis not present

## 2023-10-14 DIAGNOSIS — M25552 Pain in left hip: Secondary | ICD-10-CM | POA: Diagnosis not present

## 2023-10-15 DIAGNOSIS — H34812 Central retinal vein occlusion, left eye, with macular edema: Secondary | ICD-10-CM | POA: Diagnosis not present

## 2023-10-19 DIAGNOSIS — M25552 Pain in left hip: Secondary | ICD-10-CM | POA: Diagnosis not present

## 2023-10-19 DIAGNOSIS — R269 Unspecified abnormalities of gait and mobility: Secondary | ICD-10-CM | POA: Diagnosis not present

## 2023-10-19 DIAGNOSIS — M7918 Myalgia, other site: Secondary | ICD-10-CM | POA: Diagnosis not present

## 2023-10-26 ENCOUNTER — Other Ambulatory Visit: Payer: Self-pay | Admitting: Internal Medicine

## 2023-10-26 DIAGNOSIS — Z1231 Encounter for screening mammogram for malignant neoplasm of breast: Secondary | ICD-10-CM

## 2023-11-04 DIAGNOSIS — M25552 Pain in left hip: Secondary | ICD-10-CM | POA: Diagnosis not present

## 2023-11-04 DIAGNOSIS — M7918 Myalgia, other site: Secondary | ICD-10-CM | POA: Diagnosis not present

## 2023-11-04 DIAGNOSIS — R269 Unspecified abnormalities of gait and mobility: Secondary | ICD-10-CM | POA: Diagnosis not present

## 2023-11-09 ENCOUNTER — Ambulatory Visit (INDEPENDENT_AMBULATORY_CARE_PROVIDER_SITE_OTHER): Payer: BLUE CROSS/BLUE SHIELD

## 2023-11-11 DIAGNOSIS — M7918 Myalgia, other site: Secondary | ICD-10-CM | POA: Diagnosis not present

## 2023-11-11 DIAGNOSIS — R269 Unspecified abnormalities of gait and mobility: Secondary | ICD-10-CM | POA: Diagnosis not present

## 2023-11-11 DIAGNOSIS — M25552 Pain in left hip: Secondary | ICD-10-CM | POA: Diagnosis not present

## 2023-11-20 ENCOUNTER — Ambulatory Visit
Admission: RE | Admit: 2023-11-20 | Discharge: 2023-11-20 | Disposition: A | Discharge status: Home / Self Care | Source: Ambulatory Visit | Attending: Internal Medicine | Admitting: Internal Medicine

## 2023-11-20 DIAGNOSIS — Z1231 Encounter for screening mammogram for malignant neoplasm of breast: Secondary | ICD-10-CM

## 2023-11-25 DIAGNOSIS — R269 Unspecified abnormalities of gait and mobility: Secondary | ICD-10-CM | POA: Diagnosis not present

## 2023-11-25 DIAGNOSIS — M25552 Pain in left hip: Secondary | ICD-10-CM | POA: Diagnosis not present

## 2023-11-25 DIAGNOSIS — M7918 Myalgia, other site: Secondary | ICD-10-CM | POA: Diagnosis not present

## 2023-12-08 DIAGNOSIS — L309 Dermatitis, unspecified: Secondary | ICD-10-CM | POA: Diagnosis not present

## 2023-12-08 DIAGNOSIS — D692 Other nonthrombocytopenic purpura: Secondary | ICD-10-CM | POA: Diagnosis not present

## 2023-12-08 DIAGNOSIS — L821 Other seborrheic keratosis: Secondary | ICD-10-CM | POA: Diagnosis not present

## 2023-12-08 DIAGNOSIS — D1801 Hemangioma of skin and subcutaneous tissue: Secondary | ICD-10-CM | POA: Diagnosis not present

## 2023-12-08 DIAGNOSIS — Z85828 Personal history of other malignant neoplasm of skin: Secondary | ICD-10-CM | POA: Diagnosis not present

## 2023-12-30 ENCOUNTER — Ambulatory Visit (INDEPENDENT_AMBULATORY_CARE_PROVIDER_SITE_OTHER): Admitting: Otolaryngology

## 2023-12-30 ENCOUNTER — Encounter (INDEPENDENT_AMBULATORY_CARE_PROVIDER_SITE_OTHER): Payer: Self-pay | Admitting: Otolaryngology

## 2023-12-30 VITALS — BP 130/74 | HR 53

## 2023-12-30 DIAGNOSIS — H6123 Impacted cerumen, bilateral: Secondary | ICD-10-CM | POA: Diagnosis not present

## 2023-12-30 DIAGNOSIS — H608X3 Other otitis externa, bilateral: Secondary | ICD-10-CM

## 2023-12-30 DIAGNOSIS — H903 Sensorineural hearing loss, bilateral: Secondary | ICD-10-CM | POA: Diagnosis not present

## 2023-12-30 MED ORDER — MOMETASONE FUROATE 0.1 % EX CREA: TOPICAL_CREAM | CUTANEOUS | 3 refills | Status: AC

## 2023-12-31 DIAGNOSIS — Z961 Presence of intraocular lens: Secondary | ICD-10-CM | POA: Diagnosis not present

## 2023-12-31 DIAGNOSIS — H34812 Central retinal vein occlusion, left eye, with macular edema: Secondary | ICD-10-CM | POA: Diagnosis not present

## 2024-01-01 DIAGNOSIS — H608X3 Other otitis externa, bilateral: Secondary | ICD-10-CM | POA: Insufficient documentation

## 2024-01-01 NOTE — Progress Notes (Signed)
 Patient ID: Cindy Valencia, female   DOB: 03/24/1941, 83 y.o.   MRN: [id number]  Follow-up: Hearing loss, itchy ears  HPI: The patient is an 83 year old female who returns today for follow-up evaluation.  The patient has a history of bilateral high-frequency hearing loss and recurrent epistaxis.  She was successfully treated with cauterization of her nasal septum.  She was also fitted with hearing aids.  The patient returns today complaining of frequent itchy sensation in her ears.  She denies any recent change in her hearing.  She has not noted any recent nasal bleeding.  Exam: General: Communicates without difficulty, well nourished, no acute distress. Head: Normocephalic, no evidence injury, no tenderness, facial buttresses intact without stepoff. Face/sinus: No tenderness to palpation and percussion. Facial movement is normal and symmetric. Eyes: PERRL, EOMI. No scleral icterus, conjunctivae clear. Neuro: CN II exam reveals vision grossly intact.  No nystagmus at any point of gaze. Ears: Auricles well formed without lesions.  Bilateral cerumen impaction.  Eczematous changes are noted within the ear canals.  Nose: External evaluation reveals normal support and skin without lesions.  Dorsum is intact.  Anterior rhinoscopy reveals congested mucosa over anterior aspect of inferior turbinates and intact septum.  No purulence noted. Oral:  Oral cavity and oropharynx are intact, symmetric, without erythema or edema.  Mucosa is moist without lesions. Neck: Full range of motion without pain.  There is no significant lymphadenopathy.  No masses palpable.  Thyroid  bed within normal limits to palpation.  Parotid glands and submandibular glands equal bilaterally without mass.  Trachea is midline. Neuro:  CN 2-12 grossly intact.   Procedure: Bilateral cerumen disimpaction Anesthesia: None Description: Under the operating microscope, the cerumen is carefully removed with a combination of cerumen currette,  alligator forceps, and suction catheters.  After the cerumen is removed, the TMs are noted to be normal.  Eczematous changes are noted within the ear canals.  No mass, erythema, or lesions. The patient tolerated the procedure well.    Assessment: 1.  Bilateral recurrent cerumen impaction. 2.  Bilateral chronic eczematous otitis externa. 3.  Subjectively stable bilateral sensorineural hearing loss.  Plan: 1.  Otomicroscopy with bilateral cerumen disimpaction. 2.  The physical exam findings are reviewed with the patient. 3.  Elocon cream to treat the chronic eczematous otitis externa. 4.  Continue the use of her hearing aids. 5.  The patient will return for reevaluation in 6 months.

## 2024-02-08 DIAGNOSIS — H00015 Hordeolum externum left lower eyelid: Secondary | ICD-10-CM | POA: Diagnosis not present

## 2024-03-03 DIAGNOSIS — H34812 Central retinal vein occlusion, left eye, with macular edema: Secondary | ICD-10-CM | POA: Diagnosis not present

## 2024-03-03 DIAGNOSIS — Z961 Presence of intraocular lens: Secondary | ICD-10-CM | POA: Diagnosis not present

## 2024-04-12 DIAGNOSIS — H524 Presbyopia: Secondary | ICD-10-CM | POA: Diagnosis not present

## 2024-04-12 DIAGNOSIS — H5201 Hypermetropia, right eye: Secondary | ICD-10-CM | POA: Diagnosis not present

## 2024-04-12 DIAGNOSIS — H26491 Other secondary cataract, right eye: Secondary | ICD-10-CM | POA: Diagnosis not present

## 2024-04-15 ENCOUNTER — Ambulatory Visit (INDEPENDENT_AMBULATORY_CARE_PROVIDER_SITE_OTHER): Admitting: Family Medicine

## 2024-04-15 ENCOUNTER — Encounter: Payer: Self-pay | Admitting: Family Medicine

## 2024-04-15 ENCOUNTER — Other Ambulatory Visit: Payer: Self-pay

## 2024-04-15 VITALS — BP 90/50 | HR 47 | Ht 61.0 in | Wt 106.4 lb

## 2024-04-15 DIAGNOSIS — G5761 Lesion of plantar nerve, right lower limb: Secondary | ICD-10-CM

## 2024-04-15 DIAGNOSIS — E559 Vitamin D deficiency, unspecified: Secondary | ICD-10-CM

## 2024-04-15 DIAGNOSIS — M25572 Pain in left ankle and joints of left foot: Secondary | ICD-10-CM | POA: Diagnosis not present

## 2024-04-15 DIAGNOSIS — E611 Iron deficiency: Secondary | ICD-10-CM | POA: Diagnosis not present

## 2024-04-15 DIAGNOSIS — M79671 Pain in right foot: Secondary | ICD-10-CM

## 2024-04-15 DIAGNOSIS — R252 Cramp and spasm: Secondary | ICD-10-CM

## 2024-04-15 LAB — CBC WITH DIFFERENTIAL/PLATELET
Basophils Absolute: 0 K/uL (ref 0.0–0.1)
Basophils Relative: 0.4 % (ref 0.0–3.0)
Eosinophils Absolute: 0.1 K/uL (ref 0.0–0.7)
Eosinophils Relative: 1.2 % (ref 0.0–5.0)
HCT: 43.9 % (ref 36.0–46.0)
Hemoglobin: 14.7 g/dL (ref 12.0–15.0)
Lymphocytes Relative: 15.5 % (ref 12.0–46.0)
Lymphs Abs: 0.7 K/uL (ref 0.7–4.0)
MCHC: 33.4 g/dL (ref 30.0–36.0)
MCV: 94.9 fl (ref 78.0–100.0)
Monocytes Absolute: 0.4 K/uL (ref 0.1–1.0)
Monocytes Relative: 8.5 % (ref 3.0–12.0)
Neutro Abs: 3.4 K/uL (ref 1.4–7.7)
Neutrophils Relative %: 74.4 % (ref 43.0–77.0)
Platelets: 215 K/uL (ref 150.0–400.0)
RBC: 4.62 Mil/uL (ref 3.87–5.11)
RDW: 13.7 % (ref 11.5–15.5)
WBC: 4.6 K/uL (ref 4.0–10.5)

## 2024-04-15 LAB — COMPREHENSIVE METABOLIC PANEL WITH GFR
ALT: 32 U/L (ref 0–35)
AST: 31 U/L (ref 0–37)
Albumin: 4.4 g/dL (ref 3.5–5.2)
Alkaline Phosphatase: 59 U/L (ref 39–117)
BUN: 18 mg/dL (ref 6–23)
CO2: 30 meq/L (ref 19–32)
Calcium: 10 mg/dL (ref 8.4–10.5)
Chloride: 101 meq/L (ref 96–112)
Creatinine, Ser: 0.79 mg/dL (ref 0.40–1.20)
GFR: 69.29 mL/min (ref >60.00)
Glucose, Bld: 106 mg/dL — ABNORMAL HIGH (ref 70–99)
Potassium: 4 meq/L (ref 3.5–5.1)
Sodium: 137 meq/L (ref 135–145)
Total Bilirubin: 0.5 mg/dL (ref 0.2–1.2)
Total Protein: 7.2 g/dL (ref 6.0–8.3)

## 2024-04-15 LAB — URIC ACID: Uric Acid, Serum: 4.5 mg/dL (ref 2.4–7.0)

## 2024-04-15 LAB — IBC PANEL
Iron: 75 ug/dL (ref 42–145)
Saturation Ratios: 20.8 % (ref 20.0–50.0)
TIBC: 361.2 ug/dL (ref 250.0–450.0)
Transferrin: 258 mg/dL (ref 212.0–360.0)

## 2024-04-15 LAB — SEDIMENTATION RATE: Sed Rate: 10 mm/h (ref 0–30)

## 2024-04-15 LAB — VITAMIN B12: Vitamin B-12: 884 pg/mL (ref 211–911)

## 2024-04-15 LAB — FERRITIN: Ferritin: 39.4 ng/mL (ref 10.0–291.0)

## 2024-04-15 LAB — PROTIME-INR
INR: 1.1 ratio — ABNORMAL HIGH (ref 0.8–1.0)
Prothrombin Time: 11.6 s (ref 9.6–13.1)

## 2024-04-15 LAB — VITAMIN D 25 HYDROXY (VIT D DEFICIENCY, FRACTURES): VITD: 49.5 ng/mL (ref 30.00–100.00)

## 2024-04-15 NOTE — Progress Notes (Signed)
 [facility] Phone: [phone] Subjective:    I'm seeing this patient by the request  of:  Shayne Anes, MD  CC: right foot pain   YEP:Dlagzrupcz  Amisha Pospisil is a 83 y.o. female coming in with complaint of right foot pain. Patient states that her metatarsals feels like rocks which has been going on for years. The pain in the side of her foot is the most recent issue. Did not have an injury to cause the pain. Patient walks 4 to 5 times a week. Pain does not wake her up at night. Numbness on inside of foot and tingling in the toes. She gets acupuncture every 10 days in her toes. Haven't tried anything for pain. History of Rhynaroids. She also sometimes gets dizzy for no reason.  Onset- Several months now        Past Medical History:  Diagnosis Date   Allergy    Aortic atherosclerosis    Arthritis    Cardiac murmur    Cataract    Colon polyp 12/2007   COPD (chronic obstructive pulmonary disease) (HCC)    Endometrial polyp 06/2006   GERD (gastroesophageal reflux disease)    Hyperlipidemia    IBS (irritable bowel syndrome)    Lipoma 2000   right upper chest wall   Migraine headache    Osteopenia    Skin cancer    Skin cancer    Past Surgical History:  Procedure Laterality Date   CATARACT EXTRACTION Bilateral    central retinal vein occlusion in left eye   COLONOSCOPY     DILATATION & CURRETTAGE/HYSTEROSCOPY WITH RESECTOCOPE N/A 05/03/2013   Procedure: DILATATION & CURETTAGE/HYSTEROSCOPY WITH RESECTOCOPE;  Surgeon: Ronal Elvie Pinal, MD;  Location: [facility];  Service: Gynecology;  Laterality: N/A;   MOLE REMOVAL     TONSILLECTOMY AND ADENOIDECTOMY     VAGINAL DELIVERY     x2    Social History   Socioeconomic History   Marital status: Married    Spouse name: Not on file   Number of children: Not on file   Years of education: Not on file   Highest education level: Not on file  Occupational  History   Not on file  Tobacco Use   Smoking status: Former    Current packs/day: 0.00    Types: Cigarettes    Start date: 48    Quit date: 1969    Years since quitting: 56.7   Smokeless tobacco: Never   Tobacco comments:    pt smoked 3-4 cigarettes when smoked  Vaping Use   Vaping status: Never Used  Substance and Sexual Activity   Alcohol  use: Yes    Alcohol /week: 4.0 standard drinks of alcohol     Types: 4 Glasses of wine per week   Drug use: No   Sexual activity: Not on file    Comment: patient prefers to not be asked  Other Topics Concern   Not on file  Social History Narrative   Married to Signe Scurry -worked for [employer]   Social Drivers of [employer] Strain: Not on [employer] Insecurity: Not on file  Transportation Needs: Not on file  Physical Activity: Not on file  Stress: Not on file  Social Connections: Not on file   Allergies  Allergen Reactions   Contrast Media [Iodinated Contrast Media] Anaphylaxis    And hives and throat closed   Atorvastatin Other (See  Comments)   Codeine     REACTION: nausea   Pravastatin Other (See Comments)   Rosuvastatin Other (See Comments)   Simvastatin Other (See Comments)   Epinephrine  Palpitations    Increased heart rate   Family History  Problem Relation Age of Onset   Heart attack Mother    Osteoarthritis Mother    Lung cancer Father    Heart disease Father    Cancer Sister    Heart disease Maternal Grandmother    Heart disease Maternal Grandfather    Colon cancer Neg Hx    Rectal cancer Neg Hx    Stomach cancer Neg Hx    Colon polyps Neg Hx    Esophageal cancer Neg Hx      Current Outpatient Medications (Cardiovascular):    ezetimibe (ZETIA) 10 MG tablet, Take 1 tablet by mouth daily.  Current Outpatient Medications (Respiratory):    ANORO ELLIPTA 62.5-25 MCG/INH AEPB, SMARTSIG:1 Unspecified Via Inhaler Daily   Triamcinolone  Acetonide (NASACORT ALLERGY 24HR NA), Place  into the nose.  Current Outpatient Medications (Analgesics):    Ubrogepant (UBRELVY) 100 MG TABS, Take 1 tablet by mouth as needed.   Current Outpatient Medications (Other):    ALPHA LIPOIC ACID PO, Take 600 mg by mouth 2 (two) times daily.   AMBULATORY NON FORMULARY MEDICATION, Medication Name: Neuro-Mag Magnesium L- Threonate 144mg  2 capsules 1 hr before bedtime   Azelaic Acid 15 % gel, Apply topically at bedtime.   Biotin 5000 MCG CAPS, Take 5,000 mcg by mouth every evening.   Cholecalciferol (VITAMIN D3 PO), Take by mouth.   EVENING PRIMROSE OIL PO, Take 1,300 mg by mouth 2 (two) times daily.   fish oil-omega-3 fatty acids 1000 MG capsule, Take 1 g by mouth daily.   MAGNESIUM CITRATE PO*, Take 150 mg by mouth every evening.   mometasone  (ELOCON ) 0.1 % cream, Apply topically daily as needed for itch   Multiple Vitamin (MULTIVITAMIN WITH MINERALS) TABS tablet, Take 1 tablet by mouth daily.   mupirocin ointment (BACTROBAN) 2 %, Apply 1 Application topically as needed.   OVER THE COUNTER MEDICATION, Take 1-2 tablets by mouth 2 (two) times daily. Tart Cherry (vitamin) 2 in AM and 1 in PM   zolpidem (AMBIEN) 5 MG tablet, 1 tablet at bedtime as needed for insomnia * These medications belong to multiple therapeutic classes and are listed under each applicable group.   Reviewed prior external information including notes and imaging from  primary care provider As well as notes that were available from care everywhere and other healthcare systems.  Past medical history, social, surgical and family history all reviewed in electronic medical record.  No pertanent information unless stated regarding to the chief complaint.   Review of Systems:  No headache, visual changes, nausea, vomiting, diarrhea, constipation, dizziness, abdominal pain, skin rash, fevers, chills, night sweats, weight loss, swollen lymph nodes, body aches, joint swelling, chest pain, shortness of breath, mood changes. POSITIVE  muscle aches  Objective  Blood pressure (!) 90/50, pulse (!) 47, height 5' 1 (1.549 m), weight 106 lb 6.4 oz (48.3 kg), last menstrual period 07/14/1992, SpO2 93%.   General: No apparent distress alert and oriented x3 mood and affect normal, dressed appropriately.  HEENT: Pupils equal, extraocular movements intact  Respiratory: Patient's speak in full sentences and does not appear short of breath  Cardiovascular: No lower extremity edema, non tender, no erythema  Foot exam shows the patient has significant breakdown of the transverse arch noted with bunion  and bunionette formation noted.  Patient does have calluses with on the 1st and 5th toes on the plantar aspect.  Positive squeeze test noted on the right foot.  Limited muscular skeletal ultrasound was performed and interpreted by CLAUDENE HUSSAR, M  Limited ultrasound shows the patient does have a hypoechoic change mass noted between the 4th and 5th toes.  No abnormal vascularity.  Do believe that this is though a neuroma.  Patient has some mild arthritic changes of the midfoot but otherwise fairly unremarkable. Impression: Neuroma    Impression and Recommendations:     The above documentation has been reviewed and is accurate and complete Toussaint Golson M Reyn Faivre, DO

## 2024-04-15 NOTE — Patient Instructions (Addendum)
 Good to see you. Do prescribed exercises at least 3x a week [employer] [place] shoes Labs today Neuroma See me again in 6 weeks

## 2024-04-15 NOTE — Assessment & Plan Note (Signed)
 Neuroma noted, discussed with patient about different treatment options.  Patient elected to not try any medication, we did discuss though that there is a possibility of things such as the cramping she is having at night and will get some lab work to further evaluate.  A in addition to this we discussed over-the-counter orthotics, home exercises, with worsening pain and follow-up we will consider the possibility of injections.  Patient is in agreement with the plan and will follow-up again in 6 to 8 weeks.

## 2024-04-15 NOTE — Assessment & Plan Note (Signed)
 Has known lumbar spinal stenosis.  Concern though that patient could be having iron deficiency, we also would consider the possibility of sleep apnea.  Will start with workup at this time

## 2024-04-26 DIAGNOSIS — Z23 Encounter for immunization: Secondary | ICD-10-CM | POA: Diagnosis not present

## 2024-05-17 DIAGNOSIS — Z23 Encounter for immunization: Secondary | ICD-10-CM | POA: Diagnosis not present

## 2024-05-17 NOTE — Progress Notes (Unsigned)
 [facility] [address] [place] [address] Phone: [phone] Subjective:   C[facility], am serving as a scribe for Dr. Arthea Claudene.  I'm seeing this patient by the request  of:  Cindy Anes, MD  CC: Foot pain follow-up  YEP:Dlagzrupcz  04/15/2024 Has known lumbar spinal stenosis. Concern though that patient could be having iron deficiency, we also would consider the possibility of sleep apnea. Will start with workup at this time  Neuroma noted, discussed with patient about different treatment options.  Patient elected to not try any medication, we did discuss though that there is a possibility of things such as the cramping she is having at night and will get some lab work to further evaluate.  A in addition to this we discussed over-the-counter orthotics, home exercises, with worsening pain and follow-up we will consider the possibility of injections.  Patient is in agreement with the plan and will follow-up again in 6 to 8 weeks.     Updated 05/19/2024 Cindy Valencia is a 83 y.o. female coming in with complaint of foot pain has gotten a bit better.  Has been wearing the over-the-counter orthotics, wondering if custom ones would be better.  Still has discomfort.  Increasing activity seems to correlate with discomfort.  Still points to more of the fifth metatarsal area.       Past Medical History:  Diagnosis Date   Allergy    Aortic atherosclerosis    Arthritis    Cardiac murmur    Cataract    Colon polyp 12/2007   COPD (chronic obstructive pulmonary disease) (HCC)    Endometrial polyp 06/2006   GERD (gastroesophageal reflux disease)    Hyperlipidemia    IBS (irritable bowel syndrome)    Lipoma 2000   right upper chest wall   Migraine headache    Osteopenia    Skin cancer    Skin cancer    Past Surgical History:  Procedure Laterality Date   CATARACT EXTRACTION Bilateral    central retinal vein occlusion in left eye    COLONOSCOPY     DILATATION & CURRETTAGE/HYSTEROSCOPY WITH RESECTOCOPE N/A 05/03/2013   Procedure: DILATATION & CURETTAGE/HYSTEROSCOPY WITH RESECTOCOPE;  Surgeon: Cindy Elvie Pinal, MD;  Location: [facility];  Service: Gynecology;  Laterality: N/A;   MOLE REMOVAL     TONSILLECTOMY AND ADENOIDECTOMY     VAGINAL DELIVERY     x2    Social History   Socioeconomic History   Marital status: Married    Spouse name: Not on file   Number of children: Not on file   Years of education: Not on file   Highest education level: Not on file  Occupational History   Not on file  Tobacco Use   Smoking status: Former    Current packs/day: 0.00    Types: Cigarettes    Start date: 29    Quit date: 1969    Years since quitting: 56.8   Smokeless tobacco: Never   Tobacco comments:    pt smoked 3-4 cigarettes when smoked  Vaping Use   Vaping status: Never Used  Substance and Sexual Activity   Alcohol  use: Yes    Alcohol /week: 4.0 standard drinks of alcohol     Types: 4 Glasses of wine per week   Drug use: No   Sexual activity: Not on file    Comment: patient prefers to not be asked  Other Topics Concern   Not on file  Social History  Narrative   Married to [employer] -worked for [employer]   Social Drivers of [employer]: Not on [employer] Insecurity: Not on file  Transportation Needs: Not on file  Physical Activity: Not on file  Stress: Not on file  Social Connections: Not on file   Allergies  Allergen Reactions   Contrast Media [Iodinated Contrast Media] Anaphylaxis    And hives and throat closed   Atorvastatin Other (See Comments)   Codeine     REACTION: nausea   Pravastatin Other (See Comments)   Rosuvastatin Other (See Comments)   Simvastatin Other (See Comments)   Epinephrine  Palpitations    Increased heart rate   Family History  Problem Relation Age of Onset   Heart attack Mother    Osteoarthritis Mother    Lung cancer Father    Heart  disease Father    Cancer Sister    Heart disease Maternal Grandmother    Heart disease Maternal Grandfather    Colon cancer Neg Hx    Rectal cancer Neg Hx    Stomach cancer Neg Hx    Colon polyps Neg Hx    Esophageal cancer Neg Hx      Current Outpatient Medications (Cardiovascular):    ezetimibe (ZETIA) 10 MG tablet, Take 1 tablet by mouth daily.  Current Outpatient Medications (Respiratory):    ANORO ELLIPTA 62.5-25 MCG/INH AEPB, SMARTSIG:1 Unspecified Via Inhaler Daily   Triamcinolone  Acetonide (NASACORT ALLERGY 24HR NA), Place into the nose.  Current Outpatient Medications (Analgesics):    Ubrogepant (UBRELVY) 100 MG TABS, Take 1 tablet by mouth as needed.   Current Outpatient Medications (Other):    ALPHA LIPOIC ACID PO, Take 600 mg by mouth 2 (two) times daily.   AMBULATORY NON FORMULARY MEDICATION, Medication Name: Neuro-Mag Magnesium L- Threonate 144mg  2 capsules 1 hr before bedtime   Azelaic Acid 15 % gel, Apply topically at bedtime.   Biotin 5000 MCG CAPS, Take 5,000 mcg by mouth every evening.   Cholecalciferol (VITAMIN D3 PO), Take by mouth.   EVENING PRIMROSE OIL PO, Take 1,300 mg by mouth 2 (two) times daily.   fish oil-omega-3 fatty acids 1000 MG capsule, Take 1 g by mouth daily.   MAGNESIUM CITRATE PO*, Take 150 mg by mouth every evening.   mometasone  (ELOCON ) 0.1 % cream, Apply topically daily as needed for itch   Multiple Vitamin (MULTIVITAMIN WITH MINERALS) TABS tablet, Take 1 tablet by mouth daily.   mupirocin ointment (BACTROBAN) 2 %, Apply 1 Application topically as needed.   OVER THE COUNTER MEDICATION, Take 1-2 tablets by mouth 2 (two) times daily. Tart Cherry (vitamin) 2 in AM and 1 in PM   zolpidem (AMBIEN) 5 MG tablet, 1 tablet at bedtime as needed for insomnia * These medications belong to multiple therapeutic classes and are listed under each applicable group.   Reviewed prior external information including notes and imaging from  primary care  provider As well as notes that were available from care everywhere and other healthcare systems.  Past medical history, social, surgical and family history all reviewed in electronic medical record.  No pertanent information unless stated regarding to the chief complaint.   Review of Systems:  No headache, visual changes, nausea, vomiting, diarrhea, constipation, dizziness, abdominal pain, skin rash, fevers, chills, night sweats, weight loss, swollen lymph nodes, body aches, joint swelling, chest pain, shortness of breath, mood changes. POSITIVE muscle aches  Objective  Blood pressure 102/68, pulse 73, height  5' 1 (1.549 m), weight 107 lb (48.5 kg), last menstrual period 07/14/1992, SpO2 99%.   General: No apparent distress alert and oriented x3 mood and affect normal, dressed appropriately.  HEENT: Pupils equal, extraocular movements intact  Respiratory: Patient's speak in full sentences and does not appear short of breath  Cardiovascular: No lower extremity edema, non tender, no erythema  Foot exam shows the patient does have breakdown of the transverse arch noted.  Bunion and bunionette formation noted.  Limited muscular skeletal ultrasound was performed and interpreted by CLAUDENE HUSSAR, M  Limited ultrasound shows that patient does still have neuroma noted between the 4th and 5th toes.  Hypoechoic changes noted may be small interval improvement. Impression: Seems stable.    Impression and Recommendations:     The above documentation has been reviewed and is accurate and complete Valincia Touch M Ruthell Feigenbaum, DO

## 2024-05-19 ENCOUNTER — Ambulatory Visit: Admitting: Family Medicine

## 2024-05-19 ENCOUNTER — Other Ambulatory Visit: Payer: Self-pay

## 2024-05-19 VITALS — BP 102/68 | HR 73 | Ht 61.0 in | Wt 107.0 lb

## 2024-05-19 DIAGNOSIS — M79671 Pain in right foot: Secondary | ICD-10-CM | POA: Diagnosis not present

## 2024-05-19 DIAGNOSIS — G5761 Lesion of plantar nerve, right lower limb: Secondary | ICD-10-CM | POA: Diagnosis not present

## 2024-05-19 NOTE — Assessment & Plan Note (Signed)
 Still breakdown of the transverse arch noted.  We discussed potential custom orthotics that could be beneficial.  Patient declined medications and injections at this time.  Discussed other shoes that I think would be potential improvement.  Increase activity slowly.  Okay to continue to be active.  Follow-up again in 6 to 12 weeks

## 2024-05-19 NOTE — Patient Instructions (Addendum)
 Referral for custom orthotics Have your labs sent to us  [phone] [facility] [employer] See me again in 2-3 months

## 2024-06-01 NOTE — Progress Notes (Signed)
 Ophthalmology Clinic Note     CHIEF COMPLAINT 13 week follow up with OCT scan. Vision is stable   HISTORY OF PRESENT ILLNESSES Cindy Valencia is a 83 y.o. female who presents to the clinic today for a follow-up evaluation of CRVO with ME OS. She is s/p Kenalog OS x 8 on 03/03/24 and s/p IVA OS x4 02/27/22. Initially referred by Dr. McCuen. Patient has a history of ERM OS and pseudophakia OU in 2022 with Dr. McCuen (had post op CME OU - resolved with drops).   Today, the patient reports vision is off balance and the patient feels off balance and swim-headed  She denies ocular pain, flashes, floaters, or other concerns at this time.   INTERVAL OF HEALTH  - Husband was previously in a facility for an interstitial lung disease but reports he has come home (reported on 02/26/23) - Patient reported having high blood pressure (reported on 03/26/23) - Today patient reports health is stable. No recent hospitalizations or surgeries. Her husband recently passed on Feb 26, 2024.      MEDICATIONS Outpatient Encounter Medications as of 06/02/2024  Medication Sig Dispense Refill  . alpha lipoic acid, bulk, 100 % powd Take 600 mg by mouth.    . Anoro Ellipta 62.5-25 mcg/actuation dsdv ONE DOSE INHALED DAILY AS DIRECTED    . aspirin 81 mg EC tablet Take 81 mg by mouth Once Daily.    SABRA azelaic acid (Finacea) 15 % gel     . biotin (MERIBIN) 5 mg capsule Take 5,000 mcg by mouth.    . cholecalciferol (VITAMIN D3) 10 mcg (400 unit) tablet Take 2,000 Units by mouth Once Daily.    . cholecalciferol, vitamin D3, (cholecalciferol, vit D3,,bulk,) 100,000 unit/gram powd Take  by mouth.    . dextran 70-hypromellose, PF, (Artificial Tears, PF,) 0.1-0.3 % ophthalmic solution Administer  into each eyes 2 (two) times a day.    . DOCOSAHEXAENOIC ACID ORAL Take 1 g by mouth daily.    SABRA docosahexaenoic acid-epa (Fish OiL) 120-180 mg cap Take  by mouth.    . ezetimibe (ZETIA) 10 mg tablet     . fluticasone propionate  (FLONASE) 50 mcg/spray nasal spray     . magnesium 200 mg tab Take 150 mg by mouth Once Daily.    . magnesium citrate, bulk, powd Take 150 mg by mouth.    . metroNIDAZOLE 0.75 % lotn     . multivitamin (Flintstones/Extra C) chewable tablet Take 1 tablet by mouth Once Daily.    SABRA omega 3-dha-epa-fish oil (OMEGA 3) 1,000 mg capsule Take 1 g by mouth Once Daily.    . sertraline (ZOLOFT) 50 mg tablet Take 1 tablet by mouth daily.    . ubrogepant Deadra) 100 mg tab tablet Once Daily.    SABRA zolpidem (AMBIEN) 5 mg tablet 1 tablet at bedtime as needed for insomnia Orally each evening as needed for 30 days     Facility-Administered Encounter Medications as of 06/02/2024  Medication Dose Route Frequency Provider Last Rate Last Admin  . [COMPLETED] triamcinolone  acetonide (KENALOG-40) 40 mg/mL injection 4 mg  4 mg Other Once PRN Craig Michael Greven, MD   4 mg at 06/02/24 1556    ALLERGIES Allergies  Allergen Reactions  . Iodinated Contrast Media Anaphylaxis    And hives and throat closed  . Atorvastatin Other (See Comments)  . Bifidobacterium Infantis Other (See Comments)  . Codeine GI Intolerance and Other (See Comments)    REACTION: nausea  .  Iodine-131 Other (See Comments)  . Pravastatin Other (See Comments)  . Rosuvastatin Other (See Comments)  . Simvastatin Other (See Comments)  . Epinephrine  Palpitations    Increased heart rate    PAST MEDICAL HISTORY Past Medical History:  Diagnosis Date  . Arthritis   . Cataract    Past Surgical History:  Procedure Laterality Date  . AVASTIN  INJECTION Left    Procedure: AVASTIN  INJECTION  . CATARACT EXTRACTION Bilateral 2022   Procedure: CATARACT EXTRACTION; Dr Leslee  . INTRAVITREAL INJECTIONS OF PHARMACOLOGIC AGENT Left    Kenalog    SOCIAL HISTORY Social History   Tobacco Use  . Smoking status: Former    Passive exposure: Never  . Smokeless tobacco: Never    FAMILY HISTORY Family History  Problem Relation Name Age of Onset   . Cataracts Mother    . Hypertension Mother    . Stroke Mother    . Cancer Father    . Cataracts Father    . Retinal detachment Father      OPHTHALMIC REVIEW OF SYSTEMS Negative unless otherwise specified.  Redness,  Irritation, pain, discharge, dryness, itching Flashes, floaters, visual field defect Loss of vision, blurred vision , Double vision, pain on eye movement, misaligned eyes Glare, Lid abnormalities      OPHTHALMIC EXAM    Base Eye Exam     Visual Acuity (Snellen - Linear)       Right Left   Dist [place] 20/20 -2 CF at 2         Tonometry (Tonopen: Tetracaine OU, 2:58 PM)       Right Left   Pressure 14 14         Pupils       Shape React APD   Right Round  None   Left Round Sluggish APD         Neuro/Psych     Oriented x3: Yes   Mood/Affect: Normal         Dilation     Left eye: 2.5% Phenylephrine, 1.0% Tropicamide @ 3:00 PM  PT defer DFE in right eye due to driving herself home          Slit Lamp and Fundus Exam     External Exam       Right Left   External Normal Normal         Slit Lamp Exam       Right Left   Conjunctiva/Sclera White and quiet White and quiet   Cornea Clear Clear   Anterior Chamber Deep and quiet Deep and quiet   Iris Round and reactive Round and reactive   Lens pciol PCIOL   Anterior Vitreous Vitreous syneresis Vitreous syneresis         Fundus Exam       Right Left   Disc Normal 2+ Pallor   C/D Ratio 0.3 0.3   Macula Normal diffuse cystic changes  slightly increased superotemporal with dot and blot heme and cotton wool spots   Vessels Normal veins full wth some sheathing or arteries and veins   Periphery attached dot and blot heme but attached.              ASSESSMENT   1. Central retinal vein occlusion with macular edema of left eye (CMD)  OCT, Macula - OU - Both Eyes   Intravitreal Injection, Pharmacologic Agent - OS - Left Eye   triamcinolone  acetonide (KENALOG-40) 40 mg/mL  injection 4 mg    2.  Pseudophakia of both eyes       CRVO/ w arterial component.  with macular edema, left eye. Discussed etiology and potential treatment options including anti-VEGF injections. Minimal response to Avastin . Dramatically improved macular edema with initial Kenalog. Discussed complications due to Kenalog and preservatives and may continue in the future depending on anatomic response. She is aware that Kenalog is off label and has asked me to proceed in the past.  Today vastly improved cystic changes on OCT today. Did not respond to Avastin  or Eylea. Discussed the pros and cons of holding vs doing  Kenalog injection. Off label nature emphasized.  . Off label nature and side effects discussed. More swelling and less heme noted on exam today. Will proceed with Kenalog injection today.    Pseudophakia, both eyes. s/p CEIOL OS 11/08/20 and s/p CEIOL OD 02/21/21 per Dr. McCuen.     PLAN Obtained OCT today Kenalog OS x 9 today Return in about 3 months (around 09/02/2024) for OCT.     Procedure Note    Diagnosis:  CRVO  Intravitreal Kenalog injection left eye # 9  SURGEON:    Lorrene HERO. Greven, M.D.   ANESTHESIA:    Topical  The patient was identified. The efficacy of anti-VEGF agents established thru various clinical trials had been discussed.  The off-label nature of Kenalog (Triamcinolone ) was discussed as well and the patient was informed of potential side effects including endophthalmitis (infection), vitreous hemorrhage, retinal detachment, cataract, and systemic complications.  The left eye was marked, and a time out was performed identifying the left eye as the correct eye.    Subsequently after successful induction of topical anesthesia, a lid speculum was placed to expose the operative field.  The conjunctiva was numbed with cotton-tip applicators containing 4% topical Lidocaine .  This area was then cleaned with Betadine swabs.  Subsequently, utilizing a sharp 30-gauge  needle, 0.05 cc of intravitreal Kenalog (1.25 mg) was injected into the mid-vitreous cavity.  The vitreous cavity was identified and the Avastin  was seen to be present.  The central retinal artery was checked and found to be patent.  The  patient was given discharge instructions.  There were no complications.     Patient Instructions  Post operative injection instructions  1.  Most patients experience some blurred vision, floaters, and itching or mild discomfort following an injection.  This typically lasts a few days or less and is normal.  2.  If you experience increased levels of eye pain and discomfort, significant decreased vision, flashes of light, increased redness, sensitivity to light, fever or other unexpected symptoms , please contact us  immediately.  3.  Tylenol  or Motrin or Aleve may be taken for a headache after the injection.  4.  Avoid rubbing your eyes.  5.  Always wash your hands prior to putting in eye drops.  How to use eyedrops.  Tilt your head back slightly and gently pull your lower eyelid down.  Squeeze one drop into the pocket formed by pulling your lid down.  If using more than one type of drop, wait 5 minutes between medications.  Eye Drop Instructions  You may use artificial tear drops if your eye is irritated.  Brands include Refresh and Systane.       Lorrene HERO. Cheree MD, FACS Charlie JUDITHANN Ferrier Professor and [facility] Department of Ophthalmology [facility]  Abbreviations DM diabetes mellitus; DME diabetic macular edema; PDR proliferative diabetic retinopathy; NPDR non proliferative diabetic retinopathy; Ex  AMD exudative age related macular degeneration; Dry AMD  Dry age related macular degeneration; BRVO  Branch retinal vein occlusion; CRVO central retinal vein occlusion; ME macular edema; RD retinal detachment; RT retinal tear; SB scleral buckle; PPV pars plana vitrecctomy; OAG Open angle glaucoma; CE  IOL cataract  extraction with intraocular lens;  PC IOL  posterior chamber intraocular lens; VH  Vitreous hemorrhage; PRP  panretinal laser photocoagulation;  AC IOL anterior chamber intraocular lens IVK intravitreal kenalog; IVA intravitreal avastin ; IVL intravitreal Lucentis; IVE Intravitreal Eylea; MFC multifocal choroiditis; PVD posterior vitreous detachment; OD right eye; OS left eye; OU  Both eyes;  VMT  vitreomacular traction;  MI  Myocardial infarction;   COPD  Chronic obstructive pulmonary disease, CRF  Chronic renal failure; HTN  Hypertension; MH  Macular hole; ERM epiretinal membrane; NVD neovascularization of the disc,; NVE neovascularization elsewhere; AREDS age related eye disease study  This document serves as a record of services personally performed by Lorrene Pugh, MD. It was created on their behalf by Elroy Dusky, a trained medical scribe. The creation of this record is the provider's dictation and/or activities during the visit.  I agree the documentation is accurate and complete.  Electronically signed by: Lorrene Pugh MD 06/02/2024 3:56 PM

## 2024-06-02 DIAGNOSIS — H34812 Central retinal vein occlusion, left eye, with macular edema: Secondary | ICD-10-CM | POA: Diagnosis not present

## 2024-06-15 DIAGNOSIS — H26491 Other secondary cataract, right eye: Secondary | ICD-10-CM | POA: Diagnosis not present

## 2024-06-16 ENCOUNTER — Ambulatory Visit: Admitting: Sports Medicine

## 2024-06-16 ENCOUNTER — Encounter: Payer: Self-pay | Admitting: Sports Medicine

## 2024-06-16 VITALS — BP 104/68 | Ht 61.0 in | Wt 103.0 lb

## 2024-06-16 DIAGNOSIS — M7742 Metatarsalgia, left foot: Secondary | ICD-10-CM | POA: Diagnosis not present

## 2024-06-16 DIAGNOSIS — M7741 Metatarsalgia, right foot: Secondary | ICD-10-CM

## 2024-06-16 NOTE — Progress Notes (Signed)
 Established Patient Office Visit  PCP: Shayne Anes, MD  Patient is a 83 y.o. female here for orthotics.  She has a known history of pes cavus deformity bilaterally and endorses bilateral forefoot pain.  She has had multiple sets of orthotics previously.  Most recently she was seen by Dr. Claudene on 11/6 endorsing bilateral foot discomfort. Potential benefits of custom orthotics were discussed.    Past Medical History:  Diagnosis Date   Allergy    Aortic atherosclerosis    Arthritis    Cardiac murmur    Cataract    Colon polyp 12/2007   COPD (chronic obstructive pulmonary disease) (HCC)    Endometrial polyp 06/2006   GERD (gastroesophageal reflux disease)    Hyperlipidemia    IBS (irritable bowel syndrome)    Lipoma 2000   right upper chest wall   Migraine headache    Osteopenia    Skin cancer    Skin cancer     Current Outpatient Medications on File Prior to Visit  Medication Sig Dispense Refill   ALPHA LIPOIC ACID PO Take 600 mg by mouth 2 (two) times daily.     AMBULATORY NON FORMULARY MEDICATION Medication Name: Neuro-Mag Magnesium L- Threonate 144mg  2 capsules 1 hr before bedtime     ANORO ELLIPTA 62.5-25 MCG/INH AEPB SMARTSIG:1 Unspecified Via Inhaler Daily     Azelaic Acid 15 % gel Apply topically at bedtime.     Biotin 5000 MCG CAPS Take 5,000 mcg by mouth every evening.     Cholecalciferol (VITAMIN D3 PO) Take by mouth.     EVENING PRIMROSE OIL PO Take 1,300 mg by mouth 2 (two) times daily.     ezetimibe (ZETIA) 10 MG tablet Take 1 tablet by mouth daily.     fish oil-omega-3 fatty acids 1000 MG capsule Take 1 g by mouth daily.     MAGNESIUM CITRATE PO Take 150 mg by mouth every evening.     mometasone  (ELOCON ) 0.1 % cream Apply topically daily as needed for itch 15 g 3   Multiple Vitamin (MULTIVITAMIN WITH MINERALS) TABS tablet Take 1 tablet by mouth daily.     mupirocin ointment (BACTROBAN) 2 % Apply 1 Application topically as needed.     OVER THE COUNTER  MEDICATION Take 1-2 tablets by mouth 2 (two) times daily. Tart Cherry (vitamin) 2 in AM and 1 in PM     Triamcinolone  Acetonide (NASACORT ALLERGY 24HR NA) Place into the nose.     Ubrogepant (UBRELVY) 100 MG TABS Take 1 tablet by mouth as needed.     zolpidem (AMBIEN) 5 MG tablet 1 tablet at bedtime as needed for insomnia     No current facility-administered medications on file prior to visit.    Past Surgical History:  Procedure Laterality Date   CATARACT EXTRACTION Bilateral    central retinal vein occlusion in left eye   COLONOSCOPY     DILATATION & CURRETTAGE/HYSTEROSCOPY WITH RESECTOCOPE N/A 05/03/2013   Procedure: DILATATION & CURETTAGE/HYSTEROSCOPY WITH RESECTOCOPE;  Surgeon: Ronal Elvie Pinal, MD;  Location: [facility];  Service: Gynecology;  Laterality: N/A;   MOLE REMOVAL     TONSILLECTOMY AND ADENOIDECTOMY     VAGINAL DELIVERY     x2     Allergies  Allergen Reactions   Contrast Media [Iodinated Contrast Media] Anaphylaxis    And hives and throat closed   Atorvastatin Other (See Comments)   Codeine     REACTION: nausea   Pravastatin Other (See Comments)  Rosuvastatin Other (See Comments)   Simvastatin Other (See Comments)   Epinephrine  Palpitations    Increased heart rate    BP 104/68   Ht 5' 1 (1.549 m)   Wt 103 lb (46.7 kg)   LMP 07/14/1992   BMI 19.46 kg/m       No data to display              No data to display              Objective:  Physical Exam:  Gen: NAD, comfortable in exam room  Bilateral feet Bilateral pes cavus deformity Transverse arch collapse noted bilaterally Bilateral hallux rigidus Subluxation of the fifth toe on the right foot Positive 'squeeze test' on the right   Assessment and Plan:  Metatarsalgia Patient was evaluated today for chronic bilateral forefoot pain.  She has pes cavus deformities with transverse arch collapse noted bilaterally.  She has dress orthotics that do not provide sufficient cushioning.   Today she was fitted for plastazote inserts, which were molded to her feet.  Additional forefoot and heel padding was added.  Metatarsal pads were also added to the plastazote inserts as well as to her dress orthotics.  The inserts did not fit into the dress shoes she wore today.  She plans to return to our office tomorrow with her tennis shoes to ensure appropriate fit and comfort.  If not, further adjustments will be made.   Manus FORBES Fireman, MD

## 2024-06-21 ENCOUNTER — Ambulatory Visit (INDEPENDENT_AMBULATORY_CARE_PROVIDER_SITE_OTHER): Admitting: Otolaryngology

## 2024-06-23 ENCOUNTER — Ambulatory Visit: Admitting: Family Medicine

## 2024-07-19 NOTE — Progress Notes (Unsigned)
 " [name] [address] [place] [address] Phone: [phone] Subjective:    I'm seeing this patient by the request  of:  Shayne Anes, MD  CC:   YEP:[id number]  05/19/2024 Still breakdown of the transverse arch noted.  We discussed potential custom orthotics that could be beneficial.  Patient declined medications and injections at this time.  Discussed other shoes that I think would be potential improvement.  Increase activity slowly.  Okay to continue to be active.  Follow-up again in 6 to 12 weeks   Updated 07/21/2024 Cindy Valencia is a 84 y.o. female coming in with complaint of foot pain       Past Medical History:  Diagnosis Date   Allergy    Aortic atherosclerosis    Arthritis    Cardiac murmur    Cataract    Colon polyp 12/2007   COPD (chronic obstructive pulmonary disease) (HCC)    Endometrial polyp 06/2006   GERD (gastroesophageal reflux disease)    Hyperlipidemia    IBS (irritable bowel syndrome)    Lipoma 2000   right upper chest wall   Migraine headache    Osteopenia    Skin cancer    Skin cancer    Past Surgical History:  Procedure Laterality Date   CATARACT EXTRACTION Bilateral    central retinal vein occlusion in left eye   COLONOSCOPY     DILATATION & CURRETTAGE/HYSTEROSCOPY WITH RESECTOCOPE N/A 05/03/2013   Procedure: DILATATION & CURETTAGE/HYSTEROSCOPY WITH RESECTOCOPE;  Surgeon: Ronal Elvie Pinal, MD;  Location: [facility];  Service: Gynecology;  Laterality: N/A;   MOLE REMOVAL     TONSILLECTOMY AND ADENOIDECTOMY     VAGINAL DELIVERY     x2    Social History   Socioeconomic History   Marital status: Married    Spouse name: Not on file   Number of children: Not on file   Years of education: Not on file   Highest education level: Not on file  Occupational History   Not on file  Tobacco Use   Smoking status: Former    Current packs/day: 0.00    Types: Cigarettes    Start date: 92    Quit date:  1969    Years since quitting: 57.0   Smokeless tobacco: Never   Tobacco comments:    pt smoked 3-4 cigarettes when smoked  Vaping Use   Vaping status: Never Used  Substance and Sexual Activity   Alcohol  use: Yes    Alcohol /week: 4.0 standard drinks of alcohol     Types: 4 Glasses of wine per week   Drug use: No   Sexual activity: Not on file    Comment: patient prefers to not be asked  Other Topics Concern   Not on file  Social History Narrative   Married to [employer] -worked for [employer]   Social Drivers of Health   Tobacco Use: Medium Risk (06/16/2024)   Patient History    Smoking Tobacco Use: Former    Smokeless Tobacco Use: Never    Passive Exposure: Not on Actuary Strain: Not on file  Food Insecurity: Not on file  Transportation Needs: Not on file  Physical Activity: Not on file  Stress: Not on file  Social Connections: Not on file  Depression (PHQ2-9): Not on file  Alcohol  Screen: Not on file  Housing: Not on file  Utilities: Not on file  Health Literacy: Not on file   Allergies[1]  Family History  Problem Relation Age of Onset   Heart attack Mother    Osteoarthritis Mother    Lung cancer Father    Heart disease Father    Cancer Sister    Heart disease Maternal Grandmother    Heart disease Maternal Grandfather    Colon cancer Neg Hx    Rectal cancer Neg Hx    Stomach cancer Neg Hx    Colon polyps Neg Hx    Esophageal cancer Neg Hx     Current Outpatient Medications (Cardiovascular):    ezetimibe (ZETIA) 10 MG tablet, Take 1 tablet by mouth daily.  Current Outpatient Medications (Respiratory):    ANORO ELLIPTA 62.5-25 MCG/INH AEPB, SMARTSIG:1 Unspecified Via Inhaler Daily   Triamcinolone  Acetonide (NASACORT ALLERGY 24HR NA), Place into the nose.  Current Outpatient Medications (Analgesics):    Ubrogepant (UBRELVY) 100 MG TABS, Take 1 tablet by mouth as needed.  Current Outpatient Medications (Other):    ALPHA LIPOIC  ACID PO, Take 600 mg by mouth 2 (two) times daily.   AMBULATORY NON FORMULARY MEDICATION, Medication Name: Neuro-Mag Magnesium L- Threonate 144mg  2 capsules 1 hr before bedtime   Azelaic Acid 15 % gel, Apply topically at bedtime.   Biotin 5000 MCG CAPS, Take 5,000 mcg by mouth every evening.   Cholecalciferol (VITAMIN D3 PO), Take by mouth.   EVENING PRIMROSE OIL PO, Take 1,300 mg by mouth 2 (two) times daily.   fish oil-omega-3 fatty acids 1000 MG capsule, Take 1 g by mouth daily.   MAGNESIUM CITRATE PO*, Take 150 mg by mouth every evening.   mometasone  (ELOCON ) 0.1 % cream, Apply topically daily as needed for itch   Multiple Vitamin (MULTIVITAMIN WITH MINERALS) TABS tablet, Take 1 tablet by mouth daily.   mupirocin ointment (BACTROBAN) 2 %, Apply 1 Application topically as needed.   OVER THE COUNTER MEDICATION, Take 1-2 tablets by mouth 2 (two) times daily. Tart Cherry (vitamin) 2 in AM and 1 in PM   zolpidem (AMBIEN) 5 MG tablet, 1 tablet at bedtime as needed for insomnia  * These medications belong to multiple therapeutic classes and are listed under each applicable group.   Reviewed prior external information including notes and imaging from  primary care provider As well as notes that were available from care everywhere and other healthcare systems.  Past medical history, social, surgical and family history all reviewed in electronic medical record.  No pertanent information unless stated regarding to the chief complaint.   Review of Systems:  No headache, visual changes, nausea, vomiting, diarrhea, constipation, dizziness, abdominal pain, skin rash, fevers, chills, night sweats, weight loss, swollen lymph nodes, body aches, joint swelling, chest pain, shortness of breath, mood changes. POSITIVE muscle aches  Objective  Last menstrual period 07/14/1992.   General: No apparent distress alert and oriented x3 mood and affect normal, dressed appropriately.  HEENT: Pupils equal,  extraocular movements intact  Respiratory: Patient's speak in full sentences and does not appear short of breath  Cardiovascular: No lower extremity edema, non tender, no erythema      Impression and Recommendations:           [1]  Allergies Allergen Reactions   Contrast Media [Iodinated Contrast Media] Anaphylaxis    And hives and throat closed   Atorvastatin Other (See Comments)   Codeine     REACTION: nausea   Pravastatin Other (See Comments)   Rosuvastatin Other (See Comments)   Simvastatin Other (See Comments)   Epinephrine  Palpitations  Increased heart rate   "

## 2024-07-21 ENCOUNTER — Ambulatory Visit: Admitting: Family Medicine
